# Patient Record
Sex: Female | Born: 1960 | Race: White | Hispanic: No | Smoking: Current every day smoker
Health system: Southern US, Community
[De-identification: ages and names within clinical notes are randomized; demographics above are authoritative.]

## PROBLEM LIST (undated history)

## (undated) DIAGNOSIS — C801 Malignant (primary) neoplasm, unspecified: Secondary | ICD-10-CM

## (undated) DIAGNOSIS — E119 Type 2 diabetes mellitus without complications: Secondary | ICD-10-CM

---

## 2004-03-18 ENCOUNTER — Emergency Department: Payer: Self-pay | Admitting: Emergency Medicine

## 2004-07-29 ENCOUNTER — Emergency Department: Payer: Self-pay | Admitting: Emergency Medicine

## 2005-05-13 ENCOUNTER — Emergency Department: Payer: Self-pay | Admitting: Unknown Physician Specialty

## 2005-10-19 ENCOUNTER — Other Ambulatory Visit: Payer: Self-pay

## 2005-10-19 ENCOUNTER — Emergency Department: Payer: Self-pay

## 2005-11-17 ENCOUNTER — Ambulatory Visit: Payer: Self-pay

## 2006-11-30 ENCOUNTER — Emergency Department: Payer: Self-pay | Admitting: Emergency Medicine

## 2006-11-30 ENCOUNTER — Other Ambulatory Visit: Payer: Self-pay

## 2007-11-23 ENCOUNTER — Other Ambulatory Visit: Payer: Self-pay

## 2007-11-23 ENCOUNTER — Emergency Department: Payer: Self-pay | Admitting: Emergency Medicine

## 2008-07-11 ENCOUNTER — Ambulatory Visit: Payer: Self-pay | Admitting: Ophthalmology

## 2008-07-24 ENCOUNTER — Ambulatory Visit: Payer: Self-pay | Admitting: Ophthalmology

## 2008-09-19 ENCOUNTER — Ambulatory Visit: Payer: Self-pay

## 2008-10-02 ENCOUNTER — Ambulatory Visit: Payer: Self-pay

## 2010-07-29 ENCOUNTER — Ambulatory Visit: Payer: Self-pay | Admitting: Family Medicine

## 2010-09-18 ENCOUNTER — Ambulatory Visit: Payer: Self-pay | Admitting: Family Medicine

## 2010-10-08 ENCOUNTER — Ambulatory Visit: Payer: Self-pay

## 2010-10-14 ENCOUNTER — Ambulatory Visit: Payer: Self-pay

## 2010-10-28 ENCOUNTER — Ambulatory Visit: Payer: Self-pay | Admitting: Unknown Physician Specialty

## 2010-10-30 ENCOUNTER — Ambulatory Visit: Payer: Self-pay | Admitting: Unknown Physician Specialty

## 2011-06-23 ENCOUNTER — Ambulatory Visit: Payer: Self-pay | Admitting: Internal Medicine

## 2011-07-01 ENCOUNTER — Ambulatory Visit: Payer: Self-pay | Admitting: Family Medicine

## 2011-07-09 ENCOUNTER — Ambulatory Visit: Payer: Self-pay | Admitting: Internal Medicine

## 2011-07-09 LAB — LACTATE DEHYDROGENASE: LDH: 117 U/L (ref 84–246)

## 2011-07-09 LAB — CBC CANCER CENTER
Basophil #: 0.2 x10 3/mm — ABNORMAL HIGH (ref 0.0–0.1)
Basophil %: 1 %
Eosinophil #: 0.1 x10 3/mm (ref 0.0–0.7)
Eosinophil %: 0.7 %
HCT: 44.1 % (ref 35.0–47.0)
HGB: 14.9 g/dL (ref 12.0–16.0)
Lymphocyte #: 2.8 x10 3/mm (ref 1.0–3.6)
Lymphocyte %: 17.4 %
MCH: 30 pg (ref 26.0–34.0)
MCHC: 33.8 g/dL (ref 32.0–36.0)
MCV: 89 fL (ref 80–100)
Monocyte #: 0.7 x10 3/mm (ref 0.2–0.9)
Monocyte %: 4.6 %
Neutrophil #: 12.1 x10 3/mm — ABNORMAL HIGH (ref 1.4–6.5)
Neutrophil %: 76.3 %
Platelet: 439 x10 3/mm (ref 150–440)
RBC: 4.97 10*6/uL (ref 3.80–5.20)
RDW: 14.1 % (ref 11.5–14.5)
WBC: 15.9 x10 3/mm — ABNORMAL HIGH (ref 3.6–11.0)

## 2011-07-09 LAB — SEDIMENTATION RATE: Erythrocyte Sed Rate: 17 mm/hr (ref 0–30)

## 2011-07-21 ENCOUNTER — Ambulatory Visit: Payer: Self-pay | Admitting: Internal Medicine

## 2011-07-23 LAB — CBC CANCER CENTER
Basophil #: 0.1 x10 3/mm (ref 0.0–0.1)
Eosinophil #: 0.1 x10 3/mm (ref 0.0–0.7)
HCT: 40.7 % (ref 35.0–47.0)
HGB: 13.4 g/dL (ref 12.0–16.0)
Lymphocyte #: 2.6 x10 3/mm (ref 1.0–3.6)
Lymphocyte %: 22 %
MCH: 29.2 pg (ref 26.0–34.0)
MCHC: 33 g/dL (ref 32.0–36.0)
MCV: 89 fL (ref 80–100)
Monocyte #: 0.7 x10 3/mm (ref 0.2–0.9)
Monocyte %: 6 %
Neutrophil #: 8.3 x10 3/mm — ABNORMAL HIGH (ref 1.4–6.5)
Neutrophil %: 70.2 %
Platelet: 458 x10 3/mm — ABNORMAL HIGH (ref 150–440)
RBC: 4.59 10*6/uL (ref 3.80–5.20)
RDW: 14.5 % (ref 11.5–14.5)

## 2011-08-01 ENCOUNTER — Ambulatory Visit: Payer: Self-pay | Admitting: Internal Medicine

## 2011-09-23 ENCOUNTER — Ambulatory Visit: Payer: Self-pay | Admitting: Obstetrics & Gynecology

## 2011-09-23 LAB — BASIC METABOLIC PANEL
Anion Gap: 8 (ref 7–16)
Calcium, Total: 8.6 mg/dL (ref 8.5–10.1)
Chloride: 100 mmol/L (ref 98–107)
Co2: 30 mmol/L (ref 21–32)
Creatinine: 0.68 mg/dL (ref 0.60–1.30)
EGFR (African American): >60
EGFR (Non-African Amer.): >60
Glucose: 139 mg/dL — ABNORMAL HIGH (ref 65–99)
Osmolality: 276 (ref 275–301)
Sodium: 138 mmol/L (ref 136–145)

## 2011-09-23 LAB — CBC
HCT: 44.5 % (ref 35.0–47.0)
HGB: 14.9 g/dL (ref 12.0–16.0)
MCH: 29.7 pg (ref 26.0–34.0)
MCHC: 33.5 g/dL (ref 32.0–36.0)
MCV: 89 fL (ref 80–100)
RBC: 5.01 10*6/uL (ref 3.80–5.20)
WBC: 12.6 10*3/uL — ABNORMAL HIGH (ref 3.6–11.0)

## 2011-09-25 ENCOUNTER — Ambulatory Visit: Payer: Self-pay | Admitting: Obstetrics & Gynecology

## 2011-09-25 LAB — POTASSIUM: Potassium: 3 mmol/L — ABNORMAL LOW (ref 3.5–5.1)

## 2011-09-30 LAB — PATHOLOGY REPORT

## 2011-10-06 ENCOUNTER — Ambulatory Visit: Payer: Self-pay | Admitting: Family Medicine

## 2011-10-20 ENCOUNTER — Ambulatory Visit: Payer: Self-pay | Admitting: Internal Medicine

## 2011-10-20 ENCOUNTER — Ambulatory Visit: Payer: Self-pay | Admitting: Obstetrics & Gynecology

## 2011-10-20 LAB — CBC
HCT: 44.8 % (ref 35.0–47.0)
MCH: 29.5 pg (ref 26.0–34.0)
MCHC: 33.5 g/dL (ref 32.0–36.0)
MCV: 88 fL (ref 80–100)
Platelet: 567 10*3/uL — ABNORMAL HIGH (ref 150–440)
RBC: 5.07 10*6/uL (ref 3.80–5.20)
RDW: 13.7 % (ref 11.5–14.5)

## 2011-10-20 LAB — BASIC METABOLIC PANEL
Anion Gap: 11 (ref 7–16)
Chloride: 101 mmol/L (ref 98–107)
Co2: 27 mmol/L (ref 21–32)
EGFR (African American): >60
EGFR (Non-African Amer.): >60
Glucose: 127 mg/dL — ABNORMAL HIGH (ref 65–99)
Potassium: 2.9 mmol/L — ABNORMAL LOW (ref 3.5–5.1)

## 2011-10-20 LAB — PROTIME-INR
INR: 1
Prothrombin Time: 14 secs (ref 11.5–14.7)

## 2011-10-27 ENCOUNTER — Inpatient Hospital Stay: Payer: Self-pay | Admitting: Obstetrics & Gynecology

## 2011-10-27 LAB — POTASSIUM: Potassium: 3.5 mmol/L (ref 3.5–5.1)

## 2011-10-27 LAB — PREGNANCY, URINE: Pregnancy Test, Urine: NEGATIVE m[IU]/mL

## 2011-10-28 LAB — CREATININE, SERUM
Creatinine: 0.62 mg/dL (ref 0.60–1.30)
EGFR (African American): >60
EGFR (Non-African Amer.): >60

## 2011-11-01 ENCOUNTER — Ambulatory Visit: Payer: Self-pay | Admitting: Internal Medicine

## 2011-11-03 ENCOUNTER — Ambulatory Visit: Payer: Self-pay | Admitting: Internal Medicine

## 2011-12-01 ENCOUNTER — Ambulatory Visit: Payer: Self-pay | Admitting: Internal Medicine

## 2011-12-08 LAB — COMPREHENSIVE METABOLIC PANEL
Alkaline Phosphatase: 111 U/L (ref 50–136)
BUN: 15 mg/dL (ref 7–18)
Chloride: 102 mmol/L (ref 98–107)
Creatinine: 0.68 mg/dL (ref 0.60–1.30)
EGFR (African American): >60
EGFR (Non-African Amer.): >60
SGOT(AST): 15 U/L (ref 15–37)
SGPT (ALT): 20 U/L (ref 12–78)
Total Protein: 7.2 g/dL (ref 6.4–8.2)

## 2011-12-08 LAB — URINALYSIS, COMPLETE
Bacteria: NONE SEEN
Glucose,UR: NEGATIVE mg/dL (ref 0–75)
Nitrite: NEGATIVE
Protein: 30
RBC,UR: <1 /HPF (ref 0–5)
Squamous Epithelial: <1
WBC UR: 2 /HPF (ref 0–5)

## 2011-12-08 LAB — CBC CANCER CENTER
Eosinophil #: 0.1 x10 3/mm (ref 0.0–0.7)
HCT: 40.2 % (ref 35.0–47.0)
HGB: 13.3 g/dL (ref 12.0–16.0)
Lymphocyte %: 18.5 %
MCH: 29 pg (ref 26.0–34.0)
MCHC: 33.2 g/dL (ref 32.0–36.0)
MCV: 87 fL (ref 80–100)
Monocyte #: 0.8 x10 3/mm (ref 0.2–0.9)
Monocyte %: 6.3 %
Neutrophil #: 9.5 x10 3/mm — ABNORMAL HIGH (ref 1.4–6.5)
Neutrophil %: 73.3 %
Platelet: 465 x10 3/mm — ABNORMAL HIGH (ref 150–440)
RBC: 4.6 10*6/uL (ref 3.80–5.20)

## 2011-12-15 ENCOUNTER — Ambulatory Visit: Payer: Self-pay | Admitting: Oncology

## 2011-12-17 ENCOUNTER — Ambulatory Visit: Payer: Self-pay | Admitting: Vascular Surgery

## 2011-12-29 LAB — CBC CANCER CENTER
Basophil #: 0.2 x10 3/mm — ABNORMAL HIGH (ref 0.0–0.1)
Basophil %: 0.9 %
Eosinophil %: 0.5 %
HCT: 46 % (ref 35.0–47.0)
HGB: 14.6 g/dL (ref 12.0–16.0)
Lymphocyte #: 1.6 x10 3/mm (ref 1.0–3.6)
MCH: 28.2 pg (ref 26.0–34.0)
MCHC: 31.8 g/dL — ABNORMAL LOW (ref 32.0–36.0)
Neutrophil #: 18.1 x10 3/mm — ABNORMAL HIGH (ref 1.4–6.5)
Neutrophil %: 87.8 %
RDW: 14.7 % — ABNORMAL HIGH (ref 11.5–14.5)
WBC: 20.6 x10 3/mm — ABNORMAL HIGH (ref 3.6–11.0)

## 2011-12-29 LAB — COMPREHENSIVE METABOLIC PANEL
Alkaline Phosphatase: 208 U/L — ABNORMAL HIGH (ref 50–136)
Anion Gap: 19 — ABNORMAL HIGH (ref 7–16)
BUN: 16 mg/dL (ref 7–18)
Bilirubin,Total: 0.4 mg/dL (ref 0.2–1.0)
Calcium, Total: 9.2 mg/dL (ref 8.5–10.1)
Co2: 22 mmol/L (ref 21–32)
EGFR (Non-African Amer.): 38 — ABNORMAL LOW
Osmolality: 283 (ref 275–301)
Potassium: 2.9 mmol/L — ABNORMAL LOW (ref 3.5–5.1)
SGOT(AST): 16 U/L (ref 15–37)
Sodium: 133 mmol/L — ABNORMAL LOW (ref 136–145)

## 2011-12-31 LAB — BASIC METABOLIC PANEL
BUN: 14 mg/dL (ref 7–18)
Calcium, Total: 8.8 mg/dL (ref 8.5–10.1)
Chloride: 95 mmol/L — ABNORMAL LOW (ref 98–107)
Co2: 27 mmol/L (ref 21–32)
Creatinine: 0.91 mg/dL (ref 0.60–1.30)
EGFR (African American): >60
EGFR (Non-African Amer.): >60
Potassium: 3.5 mmol/L (ref 3.5–5.1)
Sodium: 135 mmol/L — ABNORMAL LOW (ref 136–145)

## 2011-12-31 LAB — CBC CANCER CENTER
Eosinophil %: 0.7 %
HCT: 46.2 % (ref 35.0–47.0)
HGB: 14.7 g/dL (ref 12.0–16.0)
Monocyte %: 9 %
WBC: 17.5 x10 3/mm — ABNORMAL HIGH (ref 3.6–11.0)

## 2012-01-01 ENCOUNTER — Ambulatory Visit: Payer: Self-pay | Admitting: Internal Medicine

## 2012-01-07 LAB — CBC CANCER CENTER
Basophil #: 0 x10 3/mm (ref 0.0–0.1)
Basophil %: 0.2 %
Eosinophil #: 0.2 x10 3/mm (ref 0.0–0.7)
HCT: 40.4 % (ref 35.0–47.0)
HGB: 13 g/dL (ref 12.0–16.0)
MCH: 28.1 pg (ref 26.0–34.0)
Monocyte #: 0.9 x10 3/mm (ref 0.2–0.9)
Neutrophil #: 13 x10 3/mm — ABNORMAL HIGH (ref 1.4–6.5)
Neutrophil %: 75.6 %
Platelet: 256 x10 3/mm (ref 150–440)
RBC: 4.62 10*6/uL (ref 3.80–5.20)
RDW: 14.4 % (ref 11.5–14.5)
WBC: 17.2 x10 3/mm — ABNORMAL HIGH (ref 3.6–11.0)

## 2012-01-14 LAB — CBC CANCER CENTER
Basophil #: 0.1 x10 3/mm (ref 0.0–0.1)
Eosinophil %: 0.2 %
HGB: 13.4 g/dL (ref 12.0–16.0)
Lymphocyte #: 1.8 x10 3/mm (ref 1.0–3.6)
MCH: 28.9 pg (ref 26.0–34.0)
MCV: 88 fL (ref 80–100)
Monocyte #: 0.6 x10 3/mm (ref 0.2–0.9)
Platelet: 298 x10 3/mm (ref 150–440)
RDW: 15.2 % — ABNORMAL HIGH (ref 11.5–14.5)

## 2012-01-14 LAB — COMPREHENSIVE METABOLIC PANEL
Albumin: 3.1 g/dL — ABNORMAL LOW (ref 3.4–5.0)
Alkaline Phosphatase: 140 U/L — ABNORMAL HIGH (ref 50–136)
BUN: 11 mg/dL (ref 7–18)
Calcium, Total: 9 mg/dL (ref 8.5–10.1)
Creatinine: 0.91 mg/dL (ref 0.60–1.30)
EGFR (Non-African Amer.): >60
Glucose: 268 mg/dL — ABNORMAL HIGH (ref 65–99)
Osmolality: 286 (ref 275–301)
Potassium: 3.1 mmol/L — ABNORMAL LOW (ref 3.5–5.1)
SGOT(AST): 17 U/L (ref 15–37)
SGPT (ALT): 24 U/L (ref 12–78)
Sodium: 139 mmol/L (ref 136–145)
Total Protein: 7.3 g/dL (ref 6.4–8.2)

## 2012-01-21 LAB — CBC CANCER CENTER
Basophil %: 0.9 %
Eosinophil %: 0.2 %
HCT: 42.9 % (ref 35.0–47.0)
HGB: 13.7 g/dL (ref 12.0–16.0)
Lymphocyte %: 16.3 %
MCH: 28.5 pg (ref 26.0–34.0)
Monocyte %: 7.4 %
Neutrophil %: 75.2 %
Platelet: 207 x10 3/mm (ref 150–440)
RBC: 4.82 10*6/uL (ref 3.80–5.20)
WBC: 17.2 x10 3/mm — ABNORMAL HIGH (ref 3.6–11.0)

## 2012-01-27 LAB — CBC CANCER CENTER
Eosinophil %: 0.2 %
HGB: 13.1 g/dL (ref 12.0–16.0)
Lymphocyte #: 1.7 x10 3/mm (ref 1.0–3.6)
Lymphocyte %: 13.3 %
MCH: 28.8 pg (ref 26.0–34.0)
Monocyte %: 3.8 %
Neutrophil #: 10.6 x10 3/mm — ABNORMAL HIGH (ref 1.4–6.5)
Platelet: 259 x10 3/mm (ref 150–440)
RDW: 16.6 % — ABNORMAL HIGH (ref 11.5–14.5)
WBC: 13 x10 3/mm — ABNORMAL HIGH (ref 3.6–11.0)

## 2012-01-27 LAB — BASIC METABOLIC PANEL
Anion Gap: 14 (ref 7–16)
Calcium, Total: 9.2 mg/dL (ref 8.5–10.1)
Co2: 24 mmol/L (ref 21–32)
EGFR (Non-African Amer.): >60
Glucose: 221 mg/dL — ABNORMAL HIGH (ref 65–99)
Osmolality: 285 (ref 275–301)
Potassium: 3.7 mmol/L (ref 3.5–5.1)

## 2012-01-27 LAB — HEPATIC FUNCTION PANEL A (ARMC)
Alkaline Phosphatase: 182 U/L — ABNORMAL HIGH (ref 50–136)
Bilirubin, Direct: 0.1 mg/dL (ref 0.00–0.20)
Bilirubin,Total: 0.3 mg/dL (ref 0.2–1.0)
SGPT (ALT): 29 U/L (ref 12–78)
Total Protein: 7 g/dL (ref 6.4–8.2)

## 2012-01-31 ENCOUNTER — Ambulatory Visit: Payer: Self-pay | Admitting: Internal Medicine

## 2012-02-04 LAB — COMPREHENSIVE METABOLIC PANEL
Albumin: 2.9 g/dL — ABNORMAL LOW (ref 3.4–5.0)
Alkaline Phosphatase: 116 U/L (ref 50–136)
BUN: 17 mg/dL (ref 7–18)
Calcium, Total: 9 mg/dL (ref 8.5–10.1)
Co2: 27 mmol/L (ref 21–32)
EGFR (Non-African Amer.): >60
Glucose: 225 mg/dL — ABNORMAL HIGH (ref 65–99)
Osmolality: 294 (ref 275–301)
Potassium: 3.4 mmol/L — ABNORMAL LOW (ref 3.5–5.1)
SGOT(AST): 14 U/L — ABNORMAL LOW (ref 15–37)
SGPT (ALT): 22 U/L (ref 12–78)
Sodium: 143 mmol/L (ref 136–145)

## 2012-02-04 LAB — CBC CANCER CENTER
Basophil #: 0.1 x10 3/mm (ref 0.0–0.1)
HCT: 34.7 % — ABNORMAL LOW (ref 35.0–47.0)
Lymphocyte #: 2.2 x10 3/mm (ref 1.0–3.6)
Lymphocyte %: 17.8 %
MCH: 30.3 pg (ref 26.0–34.0)
MCHC: 34.4 g/dL (ref 32.0–36.0)
MCV: 88 fL (ref 80–100)
Monocyte %: 5.6 %
Neutrophil #: 9.2 x10 3/mm — ABNORMAL HIGH (ref 1.4–6.5)
RDW: 17.4 % — ABNORMAL HIGH (ref 11.5–14.5)
WBC: 12.2 x10 3/mm — ABNORMAL HIGH (ref 3.6–11.0)

## 2012-02-11 LAB — CBC CANCER CENTER
Basophil #: 0.1 x10 3/mm (ref 0.0–0.1)
Basophil %: 1.3 %
Eosinophil #: 0.1 x10 3/mm (ref 0.0–0.7)
Eosinophil %: 1.3 %
HCT: 36.3 % (ref 35.0–47.0)
HGB: 12.5 g/dL (ref 12.0–16.0)
Lymphocyte #: 2.7 x10 3/mm (ref 1.0–3.6)
Lymphocyte %: 26.1 %
MCV: 88 fL (ref 80–100)
Monocyte #: 1.2 x10 3/mm — ABNORMAL HIGH (ref 0.2–0.9)
Neutrophil #: 6.1 x10 3/mm (ref 1.4–6.5)
Neutrophil %: 59.5 %
Platelet: 275 x10 3/mm (ref 150–440)
RBC: 4.13 10*6/uL (ref 3.80–5.20)

## 2012-02-18 LAB — CBC CANCER CENTER
Basophil %: 0.8 %
Eosinophil %: 0.1 %
MCH: 29.6 pg (ref 26.0–34.0)
MCV: 88 fL (ref 80–100)
Monocyte #: 1.1 x10 3/mm — ABNORMAL HIGH (ref 0.2–0.9)
Monocyte %: 7.7 %
Neutrophil #: 9.8 x10 3/mm — ABNORMAL HIGH (ref 1.4–6.5)
Neutrophil %: 70.2 %
Platelet: 320 x10 3/mm (ref 150–440)
RDW: 19.6 % — ABNORMAL HIGH (ref 11.5–14.5)
WBC: 14 x10 3/mm — ABNORMAL HIGH (ref 3.6–11.0)

## 2012-02-25 LAB — CBC CANCER CENTER
Basophil #: 0.2 x10 3/mm — ABNORMAL HIGH (ref 0.0–0.1)
Basophil %: 1.4 %
Eosinophil #: 0 x10 3/mm (ref 0.0–0.7)
Eosinophil %: 0.3 %
HCT: 38.4 % (ref 35.0–47.0)
HGB: 13 g/dL (ref 12.0–16.0)
Lymphocyte #: 2.7 x10 3/mm (ref 1.0–3.6)
Lymphocyte %: 22.5 %
MCHC: 33.8 g/dL (ref 32.0–36.0)
MCV: 90 fL (ref 80–100)
Monocyte #: 0.9 x10 3/mm (ref 0.2–0.9)
Monocyte %: 7.9 %
Neutrophil #: 8.1 x10 3/mm — ABNORMAL HIGH (ref 1.4–6.5)
WBC: 11.9 x10 3/mm — ABNORMAL HIGH (ref 3.6–11.0)

## 2012-03-02 ENCOUNTER — Ambulatory Visit: Payer: Self-pay | Admitting: Internal Medicine

## 2012-03-03 LAB — CBC CANCER CENTER
Basophil #: 0.1 x10 3/mm (ref 0.0–0.1)
Basophil %: 0.6 %
Eosinophil #: 0.1 x10 3/mm (ref 0.0–0.7)
Eosinophil %: 0.5 %
HCT: 32.9 % — ABNORMAL LOW (ref 35.0–47.0)
Lymphocyte %: 16.9 %
MCHC: 33.3 g/dL (ref 32.0–36.0)
Monocyte %: 5.8 %
Neutrophil #: 8.9 x10 3/mm — ABNORMAL HIGH (ref 1.4–6.5)
Platelet: 340 x10 3/mm (ref 150–440)
RBC: 3.56 10*6/uL — ABNORMAL LOW (ref 3.80–5.20)
RDW: 19.6 % — ABNORMAL HIGH (ref 11.5–14.5)
WBC: 11.7 x10 3/mm — ABNORMAL HIGH (ref 3.6–11.0)

## 2012-03-03 LAB — COMPREHENSIVE METABOLIC PANEL
Alkaline Phosphatase: 143 U/L — ABNORMAL HIGH (ref 50–136)
Anion Gap: 11 (ref 7–16)
BUN: 18 mg/dL (ref 7–18)
Bilirubin,Total: 0.2 mg/dL (ref 0.2–1.0)
Calcium, Total: 8.4 mg/dL — ABNORMAL LOW (ref 8.5–10.1)
Chloride: 99 mmol/L (ref 98–107)
Creatinine: 0.9 mg/dL (ref 0.60–1.30)
EGFR (African American): >60
EGFR (Non-African Amer.): >60
Glucose: 380 mg/dL — ABNORMAL HIGH (ref 65–99)
Osmolality: 293 (ref 275–301)
SGOT(AST): 10 U/L — ABNORMAL LOW (ref 15–37)
SGPT (ALT): 14 U/L (ref 12–78)
Sodium: 138 mmol/L (ref 136–145)

## 2012-03-31 LAB — CBC CANCER CENTER
Basophil #: 0.2 x10 3/mm — ABNORMAL HIGH (ref 0.0–0.1)
Eosinophil #: 0 x10 3/mm (ref 0.0–0.7)
Eosinophil %: 0.3 %
HCT: 39.1 % (ref 35.0–47.0)
Lymphocyte #: 3.3 x10 3/mm (ref 1.0–3.6)
Lymphocyte %: 29.5 %
MCHC: 34.4 g/dL (ref 32.0–36.0)
Monocyte %: 9 %
RBC: 4.1 10*6/uL (ref 3.80–5.20)
RDW: 16.2 % — ABNORMAL HIGH (ref 11.5–14.5)
WBC: 11 x10 3/mm (ref 3.6–11.0)

## 2012-04-02 ENCOUNTER — Ambulatory Visit: Payer: Self-pay | Admitting: Internal Medicine

## 2012-04-07 LAB — CBC CANCER CENTER
Basophil #: 0 x10 3/mm (ref 0.0–0.1)
Eosinophil #: 0.1 x10 3/mm (ref 0.0–0.7)
HGB: 11.6 g/dL — ABNORMAL LOW (ref 12.0–16.0)
Lymphocyte #: 1.3 x10 3/mm (ref 1.0–3.6)
Lymphocyte %: 24.9 %
MCV: 97 fL (ref 80–100)
Monocyte #: 0.4 x10 3/mm (ref 0.2–0.9)
Neutrophil %: 66 %
RBC: 3.38 10*6/uL — ABNORMAL LOW (ref 3.80–5.20)
RDW: 15.5 % — ABNORMAL HIGH (ref 11.5–14.5)
WBC: 5.1 x10 3/mm (ref 3.6–11.0)

## 2012-04-21 LAB — CBC CANCER CENTER
Basophil #: 0 x10 3/mm (ref 0.0–0.1)
Eosinophil %: 3.1 %
HCT: 36.4 % (ref 35.0–47.0)
Lymphocyte #: 0.8 x10 3/mm — ABNORMAL LOW (ref 1.0–3.6)
Lymphocyte %: 16.5 %
MCH: 33.8 pg (ref 26.0–34.0)
MCHC: 34 g/dL (ref 32.0–36.0)
Monocyte #: 0.4 x10 3/mm (ref 0.2–0.9)
Monocyte %: 7.8 %
Neutrophil #: 3.6 x10 3/mm (ref 1.4–6.5)
Neutrophil %: 71.8 %
Platelet: 215 x10 3/mm (ref 150–440)
RBC: 3.66 10*6/uL — ABNORMAL LOW (ref 3.80–5.20)
RDW: 13.9 % (ref 11.5–14.5)
WBC: 5 x10 3/mm (ref 3.6–11.0)

## 2012-04-28 LAB — CBC CANCER CENTER
Eosinophil %: 6 %
HGB: 13.1 g/dL (ref 12.0–16.0)
Lymphocyte #: 0.8 x10 3/mm — ABNORMAL LOW (ref 1.0–3.6)
MCH: 34.3 pg — ABNORMAL HIGH (ref 26.0–34.0)
MCV: 99 fL (ref 80–100)
Monocyte #: 0.5 x10 3/mm (ref 0.2–0.9)
Monocyte %: 9 %
Neutrophil #: 4.1 x10 3/mm (ref 1.4–6.5)
Platelet: 259 x10 3/mm (ref 150–440)
RBC: 3.82 10*6/uL (ref 3.80–5.20)
WBC: 5.7 x10 3/mm (ref 3.6–11.0)

## 2012-04-30 ENCOUNTER — Ambulatory Visit: Payer: Self-pay | Admitting: Internal Medicine

## 2012-05-07 ENCOUNTER — Emergency Department: Payer: Self-pay | Admitting: Emergency Medicine

## 2012-05-07 LAB — COMPREHENSIVE METABOLIC PANEL
Albumin: 3.2 g/dL — ABNORMAL LOW (ref 3.4–5.0)
Alkaline Phosphatase: 106 U/L (ref 50–136)
Anion Gap: 13 (ref 7–16)
BUN: 21 mg/dL — ABNORMAL HIGH (ref 7–18)
Bilirubin,Total: 0.3 mg/dL (ref 0.2–1.0)
Calcium, Total: 8.8 mg/dL (ref 8.5–10.1)
Chloride: 102 mmol/L (ref 98–107)
Co2: 24 mmol/L (ref 21–32)
Creatinine: 1.33 mg/dL — ABNORMAL HIGH (ref 0.60–1.30)
EGFR (African American): 53 — ABNORMAL LOW
EGFR (Non-African Amer.): 46 — ABNORMAL LOW
Glucose: 166 mg/dL — ABNORMAL HIGH (ref 65–99)
Osmolality: 284 (ref 275–301)
Potassium: 2.9 mmol/L — ABNORMAL LOW (ref 3.5–5.1)
SGOT(AST): 24 U/L (ref 15–37)
SGPT (ALT): 35 U/L (ref 12–78)
Sodium: 139 mmol/L (ref 136–145)
Total Protein: 7.4 g/dL (ref 6.4–8.2)

## 2012-05-07 LAB — CBC WITH DIFFERENTIAL/PLATELET
Basophil %: 0.8 %
Eosinophil %: 0.5 %
HGB: 12.9 g/dL (ref 12.0–16.0)
MCH: 34.1 pg — ABNORMAL HIGH (ref 26.0–34.0)
MCV: 99 fL (ref 80–100)
Monocyte #: 0.8 x10 3/mm (ref 0.2–0.9)
Monocyte %: 10.1 %
Neutrophil #: 6.1 10*3/uL (ref 1.4–6.5)
Platelet: 304 10*3/uL (ref 150–440)
RBC: 3.77 10*6/uL — ABNORMAL LOW (ref 3.80–5.20)
RDW: 13.8 % (ref 11.5–14.5)

## 2012-05-07 LAB — TROPONIN I
Troponin-I: <0.02 ng/mL
Troponin-I: <0.02 ng/mL

## 2012-05-31 ENCOUNTER — Ambulatory Visit: Payer: Self-pay | Admitting: Internal Medicine

## 2012-06-30 ENCOUNTER — Ambulatory Visit: Payer: Self-pay | Admitting: Internal Medicine

## 2012-07-19 LAB — COMPREHENSIVE METABOLIC PANEL
Albumin: 2.8 g/dL — ABNORMAL LOW (ref 3.4–5.0)
Anion Gap: 4 — ABNORMAL LOW (ref 7–16)
BUN: 11 mg/dL (ref 7–18)
Calcium, Total: 9.3 mg/dL (ref 8.5–10.1)
Glucose: 146 mg/dL — ABNORMAL HIGH (ref 65–99)
Osmolality: 280 (ref 275–301)
Potassium: 3 mmol/L — ABNORMAL LOW (ref 3.5–5.1)
SGOT(AST): 13 U/L — ABNORMAL LOW (ref 15–37)
Sodium: 139 mmol/L (ref 136–145)
Total Protein: 7 g/dL (ref 6.4–8.2)

## 2012-07-19 LAB — CBC CANCER CENTER
Basophil #: 0.1 x10 3/mm (ref 0.0–0.1)
Basophil %: 1.2 %
Eosinophil #: 0 x10 3/mm (ref 0.0–0.7)
HCT: 38.3 % (ref 35.0–47.0)
Lymphocyte #: 0.8 x10 3/mm — ABNORMAL LOW (ref 1.0–3.6)
Lymphocyte %: 13.1 %
MCHC: 34.6 g/dL (ref 32.0–36.0)
Neutrophil #: 5 x10 3/mm (ref 1.4–6.5)
Platelet: 332 x10 3/mm (ref 150–440)
RBC: 4.07 10*6/uL (ref 3.80–5.20)
RDW: 12.9 % (ref 11.5–14.5)
WBC: 6.4 x10 3/mm (ref 3.6–11.0)

## 2012-07-20 LAB — CA 125: CA 125: 6.3 U/mL (ref 0.0–34.0)

## 2012-07-31 ENCOUNTER — Ambulatory Visit: Payer: Self-pay | Admitting: Internal Medicine

## 2012-08-24 ENCOUNTER — Ambulatory Visit: Payer: Self-pay | Admitting: Vascular Surgery

## 2012-08-30 ENCOUNTER — Ambulatory Visit: Payer: Self-pay | Admitting: Internal Medicine

## 2012-10-04 ENCOUNTER — Ambulatory Visit: Payer: Self-pay | Admitting: Oncology

## 2012-10-04 LAB — CREATININE, SERUM
Creatinine: 0.78 mg/dL (ref 0.60–1.30)
EGFR (African American): >60

## 2012-10-05 ENCOUNTER — Ambulatory Visit: Payer: Self-pay | Admitting: Oncology

## 2012-10-10 ENCOUNTER — Ambulatory Visit: Payer: Self-pay | Admitting: Oncology

## 2012-10-31 ENCOUNTER — Ambulatory Visit: Payer: Self-pay | Admitting: Oncology

## 2012-11-22 ENCOUNTER — Ambulatory Visit: Payer: Self-pay | Admitting: Family Medicine

## 2012-12-20 ENCOUNTER — Ambulatory Visit: Payer: Self-pay | Admitting: Oncology

## 2012-12-31 ENCOUNTER — Ambulatory Visit: Payer: Self-pay | Admitting: Oncology

## 2013-03-08 ENCOUNTER — Ambulatory Visit: Payer: Self-pay | Admitting: Oncology

## 2013-04-15 ENCOUNTER — Emergency Department: Payer: Self-pay

## 2013-04-15 LAB — URINALYSIS, COMPLETE
Bacteria: NONE SEEN
Bilirubin,UR: NEGATIVE
Glucose,UR: >=500 mg/dL (ref 0–75)
Hyaline Cast: 5
LEUKOCYTE ESTERASE: NEGATIVE
Nitrite: NEGATIVE
PH: 6 (ref 4.5–8.0)
Protein: 100
RBC,UR: 3 /HPF (ref 0–5)
SPECIFIC GRAVITY: 1.014 (ref 1.003–1.030)

## 2013-04-15 LAB — CBC
HCT: 45.3 % (ref 35.0–47.0)
HGB: 15 g/dL (ref 12.0–16.0)
MCH: 29.6 pg (ref 26.0–34.0)
MCHC: 33.1 g/dL (ref 32.0–36.0)
MCV: 90 fL (ref 80–100)
Platelet: 317 10*3/uL (ref 150–440)
RBC: 5.06 10*6/uL (ref 3.80–5.20)
RDW: 14.8 % — AB (ref 11.5–14.5)
WBC: 10.9 10*3/uL (ref 3.6–11.0)

## 2013-04-15 LAB — DRUG SCREEN, URINE
Amphetamines, Ur Screen: NEGATIVE (ref ?–1000)
BENZODIAZEPINE, UR SCRN: NEGATIVE (ref ?–200)
Barbiturates, Ur Screen: NEGATIVE (ref ?–200)
CANNABINOID 50 NG, UR ~~LOC~~: POSITIVE (ref ?–50)
Cocaine Metabolite,Ur ~~LOC~~: NEGATIVE (ref ?–300)
MDMA (ECSTASY) UR SCREEN: NEGATIVE (ref ?–500)
Methadone, Ur Screen: NEGATIVE (ref ?–300)
OPIATE, UR SCREEN: NEGATIVE (ref ?–300)
Phencyclidine (PCP) Ur S: NEGATIVE (ref ?–25)
Tricyclic, Ur Screen: NEGATIVE (ref ?–1000)

## 2013-04-15 LAB — TSH: Thyroid Stimulating Horm: 0.5 u[IU]/mL

## 2013-04-15 LAB — ETHANOL: Ethanol %: <0.003 % (ref 0.000–0.080)

## 2013-04-15 LAB — COMPREHENSIVE METABOLIC PANEL
ALBUMIN: 2.9 g/dL — AB (ref 3.4–5.0)
ANION GAP: 8 (ref 7–16)
Alkaline Phosphatase: 122 U/L — ABNORMAL HIGH
BUN: 6 mg/dL — ABNORMAL LOW (ref 7–18)
Bilirubin,Total: 0.4 mg/dL (ref 0.2–1.0)
CHLORIDE: 99 mmol/L (ref 98–107)
CREATININE: 0.67 mg/dL (ref 0.60–1.30)
Calcium, Total: 8.8 mg/dL (ref 8.5–10.1)
Co2: 29 mmol/L (ref 21–32)
EGFR (African American): >60
EGFR (Non-African Amer.): >60
Glucose: 250 mg/dL — ABNORMAL HIGH (ref 65–99)
Osmolality: 278 (ref 275–301)
POTASSIUM: 2.7 mmol/L — AB (ref 3.5–5.1)
SGOT(AST): 12 U/L — ABNORMAL LOW (ref 15–37)
SGPT (ALT): 16 U/L (ref 12–78)
Sodium: 136 mmol/L (ref 136–145)
Total Protein: 7.4 g/dL (ref 6.4–8.2)

## 2013-05-01 ENCOUNTER — Ambulatory Visit: Payer: Self-pay | Admitting: Oncology

## 2014-03-09 DIAGNOSIS — J449 Chronic obstructive pulmonary disease, unspecified: Secondary | ICD-10-CM | POA: Insufficient documentation

## 2014-03-09 DIAGNOSIS — E119 Type 2 diabetes mellitus without complications: Secondary | ICD-10-CM | POA: Insufficient documentation

## 2014-03-09 DIAGNOSIS — C55 Malignant neoplasm of uterus, part unspecified: Secondary | ICD-10-CM | POA: Insufficient documentation

## 2014-03-09 DIAGNOSIS — I10 Essential (primary) hypertension: Secondary | ICD-10-CM | POA: Insufficient documentation

## 2014-03-14 ENCOUNTER — Ambulatory Visit: Payer: Self-pay | Admitting: Oncology

## 2014-03-14 LAB — CREATININE, SERUM
Creatinine: 0.67 mg/dL (ref 0.60–1.30)
EGFR (African American): >60

## 2014-03-15 LAB — CA 125: CA 125: 89.7 U/mL — ABNORMAL HIGH (ref 0.0–34.0)

## 2014-04-02 ENCOUNTER — Ambulatory Visit: Payer: Self-pay | Admitting: Oncology

## 2014-04-03 ENCOUNTER — Ambulatory Visit: Payer: Self-pay | Admitting: Oncology

## 2014-04-12 ENCOUNTER — Ambulatory Visit: Payer: Self-pay | Admitting: Vascular Surgery

## 2014-05-01 ENCOUNTER — Ambulatory Visit
Admit: 2014-05-01 | Disposition: A | Discharge status: Home / Self Care | Payer: Self-pay | Attending: Oncology | Admitting: Oncology

## 2014-05-29 LAB — CBC CANCER CENTER
BASOS PCT: 0.5 %
Basophil #: 0 x10 3/mm (ref 0.0–0.1)
EOS PCT: 0.5 %
Eosinophil #: 0 x10 3/mm (ref 0.0–0.7)
HCT: 34 % — AB (ref 35.0–47.0)
HGB: 11.6 g/dL — AB (ref 12.0–16.0)
Lymphocyte #: 1.2 x10 3/mm (ref 1.0–3.6)
Lymphocyte %: 23.7 %
MCH: 29.7 pg (ref 26.0–34.0)
MCHC: 34.2 g/dL (ref 32.0–36.0)
MCV: 87 fL (ref 80–100)
MONO ABS: 0.4 x10 3/mm (ref 0.2–0.9)
Monocyte %: 8.4 %
NEUTROS ABS: 3.4 x10 3/mm (ref 1.4–6.5)
NEUTROS PCT: 66.9 %
Platelet: 138 x10 3/mm — ABNORMAL LOW (ref 150–440)
RBC: 3.92 10*6/uL (ref 3.80–5.20)
RDW: 16.6 % — ABNORMAL HIGH (ref 11.5–14.5)
WBC: 5.1 x10 3/mm (ref 3.6–11.0)

## 2014-05-29 LAB — COMPREHENSIVE METABOLIC PANEL
ALBUMIN: 3.2 g/dL — AB
ALK PHOS: 93 U/L
ALT: 13 U/L — AB
ANION GAP: 3 — AB (ref 7–16)
AST: 18 U/L
BUN: 15 mg/dL
Bilirubin,Total: 0.4 mg/dL
CHLORIDE: 104 mmol/L
CO2: 28 mmol/L
CREATININE: 0.55 mg/dL
Calcium, Total: 8.8 mg/dL — ABNORMAL LOW
Glucose: 221 mg/dL — ABNORMAL HIGH
POTASSIUM: 3.3 mmol/L — AB
Sodium: 135 mmol/L
Total Protein: 6.8 g/dL

## 2014-05-29 LAB — MAGNESIUM: Magnesium: 1.2 mg/dL — ABNORMAL LOW

## 2014-06-01 ENCOUNTER — Ambulatory Visit
Admit: 2014-06-01 | Disposition: A | Discharge status: Home / Self Care | Payer: Self-pay | Attending: Oncology | Admitting: Oncology

## 2014-06-07 DIAGNOSIS — Z66 Do not resuscitate: Secondary | ICD-10-CM | POA: Insufficient documentation

## 2014-06-19 LAB — COMPREHENSIVE METABOLIC PANEL
ALBUMIN: 3.4 g/dL — AB
ALK PHOS: 91 U/L
ANION GAP: 9 (ref 7–16)
BUN: 20 mg/dL
Bilirubin,Total: 0.3 mg/dL
CALCIUM: 9 mg/dL
CREATININE: 0.6 mg/dL
Chloride: 101 mmol/L
Co2: 28 mmol/L
EGFR (African American): >60
EGFR (Non-African Amer.): >60
GLUCOSE: 140 mg/dL — AB
Potassium: 4 mmol/L
SGOT(AST): 21 U/L
SGPT (ALT): 12 U/L — ABNORMAL LOW
SODIUM: 138 mmol/L
TOTAL PROTEIN: 7.6 g/dL

## 2014-06-19 LAB — CBC CANCER CENTER
BASOS ABS: 0.1 x10 3/mm (ref 0.0–0.1)
Basophil %: 1.3 %
Eosinophil #: 0.1 x10 3/mm (ref 0.0–0.7)
Eosinophil %: 2.1 %
HCT: 34.2 % — ABNORMAL LOW (ref 35.0–47.0)
HGB: 11.7 g/dL — ABNORMAL LOW (ref 12.0–16.0)
LYMPHS PCT: 17.7 %
Lymphocyte #: 1.3 x10 3/mm (ref 1.0–3.6)
MCH: 30.4 pg (ref 26.0–34.0)
MCHC: 34 g/dL (ref 32.0–36.0)
MCV: 89 fL (ref 80–100)
Monocyte #: 0.7 x10 3/mm (ref 0.2–0.9)
Monocyte %: 10 %
Neutrophil #: 4.9 x10 3/mm (ref 1.4–6.5)
Neutrophil %: 68.9 %
PLATELETS: 180 x10 3/mm (ref 150–440)
RBC: 3.84 10*6/uL (ref 3.80–5.20)
RDW: 20.9 % — AB (ref 11.5–14.5)
WBC: 7.1 x10 3/mm (ref 3.6–11.0)

## 2014-06-19 LAB — MAGNESIUM: MAGNESIUM: 1.5 mg/dL — AB

## 2014-06-19 NOTE — Op Note (Signed)
PATIENT NAME:  Zoe [name], Zoe [name] MR#:  [id number] DATE OF BIRTH:  03-11-1960  DATE OF PROCEDURE:  10/27/2011  PREOPERATIVE DIAGNOSIS: Adenocarcinoma of the endometrium.   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   PROCEDURES PERFORMED: Exploratory laparotomy with radical hysterectomy, bilateral salpingo-oophorectomy, pelvic lymphadenectomy, and periaortic lymph node sampling.   CO-SURGEONS: Weber Cooks, M.D. and [name]. Barnett Applebaum, M.D.   ANESTHESIA: General.   ESTIMATED BLOOD LOSS: 150 mL.   COMPLICATIONS: None.   INDICATION FOR SURGERY: Ms. Stenseth is a 54 year old patient who presented with irregular uterine bleeding and on D`C was noted to have a grade 2 endometrioid adenocarcinoma. Treatment options were discussed and the decision was made to proceed with surgery.   FINDINGS AT TIME OF SURGERY: Inspection and palpation of the upper abdomen within normal limits. Scars between omentum and abdominal wall related to previous cholecystectomy. Uterus of normal form and size. Both adnexa normal. Slightly enlarged pelvic lymph nodes. No enlarged periaortic lymph nodes. No peritoneal lesions.   OPERATIVE REPORT: After adequate general anesthesia had been obtained, the patient was prepped and draped in supine position. A midline incision was placed with a sharp knife and carried down through the fascia. The peritoneum was identified and entered. The incision was extended cephalad and caudad. Exploration was performed with the above-mentioned findings. Cytology was taken. A Bookwalter retractor was placed and the bowel was packed out of the pelvis.  First the hysterectomy with bilateral salpingo-oophorectomy was done. This part is dictated by Dr. Kenton Kingfisher. Attention was then directed towards the pelvic lymphadenectomy which was done in a similar fashion on the right and left side. Using the Harmonic scalpel, the node bearing fatty tissue from the lower common iliac  artery around the external iliac vessels, down to the circumflex iliac vein, around the hypogastric vessels, and from the obturator space were removed. Vessels, ureter and obturator nerve were kept under constant visualization. Hemostasis was noted to be adequate at the end of the procedure.  Access to the periaortic area was difficult due to the patient's obesity. The peritoneum over the aorta was incised. The node bearing fatty tissue around the IMA was mobilized using the Harmonic scalpel and removed. Hemostasis was noted to be adequate. Arista was placed over the areas of dissection. An omental biopsy was taken using the Harmonic scalpel. Hemostasis was noted to be adequate. Inspection of the pelvis likewise revealed adequate hemostasis. Arista was again placed in the areas of nodal dissection.   Lap sponges and retractors were removed. The fascia was closed with a running loop #1 PDS suture. Irrigation of the subcutaneous tissue was performed before adequate hemostasis was confirmed. The subcutaneous tissue was then reapproximated. The skin was closed with staples. The patient tolerated the procedure well and was taken to the recovery room in satisfactory condition. Postoperative urine was clear. Pad, sponge, needle, and instrument counts were correct x2.   ____________________________ Weber Cooks, MD bem:ap D: 10/27/2011 14:23:36 ET T: 10/27/2011 15:08:47 ET JOB#: [id number]  cc: Weber Cooks, MD, <Dictator> Weber Cooks MD ELECTRONICALLY SIGNED 10/27/2011 16:21

## 2014-06-19 NOTE — Op Note (Signed)
PATIENT NAME:  Zoe [name], Zoe [name] MR#:  [id number] DATE OF BIRTH:  1960/05/06  DATE OF PROCEDURE:  10/27/2011  PREOPERATIVE DIAGNOSIS: Endometrial carcinoma.   POSTOPERATIVE DIAGNOSIS: Endometrial carcinoma.  PROCEDURES: Radical hysterectomy, pelvic lymphadenectomy, paraaortic lymph node sampling.  SURGEON: Glean Salen, MD  CO-SURGEON: Jacquelyne Balint, MD   ANESTHESIA: General.   ESTIMATED BLOOD LOSS: 250 mL.   COMPLICATIONS: None.   FINDINGS: Frozen section analysis reveals 8 cm invasive endometrial carcinoma.   DISPOSITION: To the recovery room in stable condition.   TECHNIQUE: The patient is prepped and draped in the usual sterile fashion after adequate anesthesia is obtained in the supine position on the operating room table with a Foley catheter in place. Scalpel is used to create a midline vertical skin incision from just above the pubic symphysis up to approximately 3 cm above the umbilicus circumventing the umbilicus. The incision is carried down to the level of the rectus fascia which is then incised in a superior and inferior fashion. The midline is identified and the rectus muscles are separated. Peritoneum is penetrated and careful dissection of the peritoneum is performed with inferior retraction of the bladder as needed. A Bookwalter retractor is placed for anterior abdominal retraction. The patient is placed in Trendelenburg positioning. The uterus is identified along with the adnexa. The bowel is run and examined with no obvious signs of disease or spread. The omentum is also identified. Pelvic washings are obtained.   The uterus is grasped. The round ligaments are carefully suture ligated with Vicryl suture. The round ligament is then dissected and the anterior and posterior leaves of the broad ligaments are also dissected to the level of the infundibulopelvic blood vessels and their ligaments. These are then clamped, transected, and suture ligated with excellent hemostasis  noted. On palpation and examination of the ureters reveals that they are out of harms way. The adnexa is retained with the uterine specimen. Dissection is carried down to the level of the uterine arteries which are carefully clamped, transected, and suture ligated using Vicryl sutures. Dissection is then carried to level of the external os and then clamping is performed with complete amputation of uterus and cervix with the adnexa attached. Interrupted Vicryl sutures are placed along the vaginal cuff for complete closure. The pelvic cavity is irrigated with aspiration of all fluid. Again excellent hemostasis is noted.   Pelvic lymphadenectomy and paraaortic lymph node sampling is then performed. Please see separate dictation by Dr. Jacquelyne Balint for more details.   Once the lymph node dissections are complete, the patient is leveled. A partial omentectomy is performed with this sent to pathology for further review as well. Excellent hemostasis is noted at the omentectomy site. The peritoneum and the rectus fascia is identified and the rectus fascia is closed using a #1 PDS suture. Subcutaneous tissues are irrigated and hemostasis is assured using electrocautery. A subcutaneous layer of plain gut suture is placed followed by skin closure using surgical staples. An Aquagel bandage is placed over the incision. The Foley catheter is left in place. The patient goes to the recovery room in stable condition. All sponge, instrument, and needle counts are correct.  ____________________________ [name]. Barnett Applebaum, MD rph:slb D: 10/27/2011 12:08:47 ET T: 10/27/2011 12:16:24 ET JOB#: [id number]  cc: Glean Salen, MD, <Dictator> Gae Dry MD ELECTRONICALLY SIGNED 10/27/2011 15:16

## 2014-06-19 NOTE — Op Note (Signed)
PATIENT NAME:  Zoe [name], Zoe [name] MR#:  [id number] DATE OF BIRTH:  September 16, 1960  DATE OF PROCEDURE:  12/17/2011  PREOPERATIVE DIAGNOSIS: Endometrial cancer.  POSTOPERATIVE DIAGNOSIS: Endometrial cancer.  PROCEDURE: 1. Ultrasound guidance for vascular access, left jugular vein.  2. Fluoroscopic guidance for placement of catheter.  3. Placement of a CT-compatible left jugular Port-A-Cath.   SURGEON: Algernon Huxley, M.D.   ANESTHESIA: Local with moderate conscious sedation.   ESTIMATED BLOOD LOSS: Approximately 25 mL.   FLUOROSCOPY TIME: Approximately one minute.   CONTRAST USED: None.   INDICATION FOR PROCEDURE: This is a 54 year old white female with endometrial cancer. She needs a Port-A-Cath for chemotherapy.   DESCRIPTION OF PROCEDURE: The patient was brought to the vascular and interventional radiology suite and her left neck and chest were sterilely prepped and draped and a sterile surgical field was created.  The left jugular vein was visualized with ultrasound and found to be patent but small. For this reason, a micropuncture needle was used to access the left jugular vein. This was done without difficulty with a micropuncture needle under direct ultrasound guidance. A permanent image was recorded. Micropuncture wire and sheath were placed. We then up-sized to a 0.035 sheath and parked a peel-away sheath down into the vein. I then anesthetized an area two fingerbreadths below the left clavicle and a transverse incision was created. An inferior pocket was created with electrocautery and blunt dissection and the port was secured with 2 Prolene sutures to the chest wall. The catheter was connected to the port and tunneled from the subclavicular incision to the access site. Fluoroscopic guidance was used to cut the catheter to an appropriate length. It was then placed through the peel-away sheath and the peel-away sheath was removed.  The catheter tip was found to be in good location in the superior  vena cava. It withdrew blood well and flushed easily with heparinized saline. The wound was irrigated with antibiotic-impregnated saline and closed with a running 3-0 Vicryl and 4-0 Monocryl. The access incision was closed with a single 4-0 Monocryl. Dermabond was placed as a dressing. The patient tolerated the procedure well and was taken to the recovery room in stable condition.       ____________________________ Algernon Huxley, MD jsd:bjt D: 12/17/2011 10:27:25 ET T: 12/17/2011 11:14:11 ET JOB#: [id number]  cc: Algernon Huxley, MD, <Dictator> Algernon Huxley MD ELECTRONICALLY SIGNED 12/21/2011 13:10

## 2014-06-19 NOTE — Op Note (Signed)
PATIENT NAME:  Zoe [name], Zoe [name] MR#:  [id number] DATE OF BIRTH:  1960/06/25  DATE OF PROCEDURE:  09/25/2011  PREOPERATIVE DIAGNOSIS: Abnormal uterine bleeding.   POSTOPERATIVE DIAGNOSIS: Abnormal uterine bleeding.  PROCEDURE PERFORMED: Fractional dilatation and curettage.   SURGEON: Glean Salen, M.D.   ANESTHESIA: General.   ESTIMATED BLOOD LOSS: Minimal.   COMPLICATIONS: None.   SPECIMEN: Endocervical curetting, endometrial curetting.   DISPOSITION: To recovery room stable.   TECHNIQUE: The patient is prepped and draped in the usual sterile fashion after adequate anesthesia is obtained, in the dorsal lithotomy position. A speculum is placed and the cervix is grasped with a tenaculum. The uterus is sounded to 9 cm. An endocervical specimen is obtained with a Kevorkian curette. The cervix is then dilated to a size 20 Pratt dilator and then an endometrial curettage is performed with a banjo curette. Copious amount specimen is obtained at this time. Hemostasis is noted when done with the curettage procedure. The tenaculum is removed and the patient goes to the recovery room in stable condition. All sponge, instrument, and needle counts are correct. ____________________________ [name]. Barnett Applebaum, MD rph:slb D: 09/25/2011 14:36:41 ET     T: 09/25/2011 15:16:34 ET         JOB#: [id number] cc: Glean Salen, MD, <Dictator> Gae Dry MD ELECTRONICALLY SIGNED 09/26/2011 6:58

## 2014-06-20 LAB — CA 125: CA 125: 20 U/mL (ref 0.0–34.0)

## 2014-06-22 NOTE — Consult Note (Signed)
Reason for Visit: This 54 year old Female patient presents to the clinic for initial evaluation of  endometrial carcinoma .   Referred by Dr. Oliva Bustard.  Diagnosis:   Chief Complaint/Diagnosis   54 year old female status post TAH/BSO for stage IIIc(T2, N2, M0) high-grade endometrial adenocarcinomaon adjuvant chemotherapy   Pathology Report pathology report reviewed    Imaging Report PET/CT scan reviewed    Referral Report clinical notes reviewed    Planned Treatment Regimen adjuvant whole pelvis radiation plus intracavitary vaginal brachytherapy boost    HPI   patient is a 54 year old female who is noted by her PMD to have an enlarged endometrium. [place] he underwent D&C positive for moderately differentiated adenocarcinoma. Was seen by Dr. Sabra Heck and went on to have a TAH/BSO with pelvic lymph node dissection showing an 8 cm high-grade FIGO grade 3 endometrial adenocarcinoma. There was greater than 50% myometrial invasion with just less than 1 mm to the serosal surface. 4 of 19 pelvic nodes were positive for metastatic disease in one of 2 periaortic lymph nodes were positive. There was invasion of the cervical stroma. Patient was seen by medical oncology and has been initiated on carboplatinum and Taxol chemotherapy in October 2013. She is here to initiate her fourth cycle of chemotherapy. She is doing fairly well.she is having no vaginal bleeding or pelvic pain. She specifically denies diarrhea dysuria or any other GI GU complaints.  Past Hx:    endometerial cancer:    Neuropathy:    Hemorrhoids:    Arthritis:    Anxiety:    Insomnia:    Hypertension:    Kyphosis:    PUD:    LE edema:    COPD:    depression:    smoker:    Asthma:    Panic Attacks:    Diabetes:    Shoulder surgery on right:    Cataract Extraction: Right eye, 2010   Tonsillectomy and Adenoidectomy:    Gall Bladder:   Past, Family and Social History:   Past Medical History positive     Cardiovascular hypertension    Respiratory asthma; COPD    Endocrine diabetes mellitus    Neurological/Psychiatric depression; panic attacks    Past Surgical History cholecystectomy; tonsillectomy remotely, cataract surgery, right shoulder surgery    Past Medical History Comments lower extremity edema, kyphosis, insomnia, arthritis    Family History positive    Family History Comments family history of adult onset diabetes, hyperlipidemia, hypertension, cardia vascular heart disease, bladder cancer and leukemia    Social History positive    Social History Comments greater than 50-pack-year smoking history no EtOH abuse history    Additional Past Medical and Surgical History seen by herself today   Allergies:   Codeine: GI Distress, Other  ASA: GI Distress, Other  Erythromycin: Rash, GI Distress  Novocain: Swelling, Rash  Ibuprofen: GI Distress  Aspirin: N/V/Diarrhea, GI Distress  Home Meds:  Home Medications: Medication Instructions Status  ondansetron 8 mg oral tablet 1 tab(s) orally 3 times a day, As Needed Active  Advair Diskus powder 250 mcg-50 mcg 1 puff(s)  2 times a day  Active  quinapril 10 mg oral tablet 1 tab(s) orally once a day (in the morning) Active  furosemide 20 mg oral tablet 1 tab(s) orally once a day in am Active  ProAir HFA 2 puffs inhaled QID prn Active  metformin 1000 mg oral tablet 1 tab(s) orally 2 times a day Active  Lantus Solostar Pen 100 units/mL subcutaneous solution 46 unit(s)  subcutaneous once a day (in the morning) Active  fluoxetine 20 mg oral capsule 1 cap(s) orally once a day (in the morning) Active  Neurontin 600 mg oral tablet 1 tab(s) orally 3 times a day Active  Tylenol Caplet Extra Strength 500 mg oral tablet 1 tab(s) orally 3 times a day, As Needed- for Pain  Active   Review of Systems:   General negative    Performance Status (ECOG) 0    Skin negative    Breast negative    Ophthalmologic negative    ENMT negative     Respiratory and Thorax negative    Cardiovascular negative    Gastrointestinal negative    Genitourinary see HPI    Musculoskeletal negative    Neurological negative    Psychiatric see HPI    Hematology/Lymphatics negative    Endocrine negative    Allergic/Immunologic negative    Review of Systems   according to the nurse's notesPatient denies any weight loss, fatigue, weakness, fever, chills or night sweats. Patient denies any loss of vision, blurred vision. Patient denies any ringing  of the ears or hearing loss. No irregular heartbeat. Patient denies heart murmur or history of fainting. Patient denies any chest pain or pain radiating to her upper extremities. Patient denies any shortness of breath, difficulty breathing at night, cough or hemoptysis. Patient denies any swelling in the lower legs. Patient denies any nausea vomiting, vomiting of blood, or coffee ground material in the vomitus. Patient denies any stomach pain. Patient states has had normal bowel movements no significant constipation or diarrhea. Patient denies any dysuria, hematuria or significant nocturia. Patient denies any problems walking, swelling in the joints or loss of balance. Patient denies any skin changes, loss of hair or loss of weight. Patient denies any excessive worrying or anxiety or significant depression. Patient denies any problems with insomnia. Patient denies excessive thirst, polyuria, polydipsia. Patient denies any swollen glands, patient denies easy bruising or easy bleeding. Patient denies any recent infections, allergies or URI. Patient "s visual fields have not changed significantly in recent time.  Nursing Notes:  Nursing Vital Signs and Chemo Nursing Nursing Notes: *CC Vital Signs Flowsheet:   09-Jan-14 08:27   Temp Temperature 98.2   Pulse Pulse 101   Respirations Respirations 22   DBP DBP 63   Pain Scale (0-10)  7 Legs and Feet   Current Weight (kg) (kg) 92.3   Height (cm) centimeters  162   BSA (m2) 1.9   Physical Exam:  General/Skin/HEENT:   General normal    Skin normal    Eyes normal    ENMT normal    Head and Neck normal    Additional PE well-developed well-nourished obese female in NAD. Lungs are clear to A&P cardiac examination shows regular rate and rhythm. Abdomen is benign with no organomegaly or masses noted. On speculum examination vaginal vault is clear. No evidence of mass in the vaginal apex is noted. Bimanual examination shows no evidence of mass or nodularity no parametrial. Rectal exam is within normal limits. She does have +2 edema in her lower extremities bilaterally.   Breasts/Resp/CV/GI/GU:   Respiratory and Thorax normal    Cardiovascular normal    Gastrointestinal normal    Genitourinary normal   MS/Neuro/Psych/Lymph:   Musculoskeletal normal    Neurological normal    Lymphatics normal   Assessment and Plan:  Impression:   stage IIIc endometrial adenocarcinoma FIGO grade 30 and 54 year old female  Plan:   although adjuvant radiation  combined with chemotherapy in the adjuvant setting is somewhat controversial I would treat according to GOG protocol based on her poor prognostic factors including spread to the serosal surface, positive pelvic lymph nodal metastasis, positive cervical stromal invasion. I recommended going ahead with whole pelvic radiation to 4500 cGy and then boost using high-dose rate remote afterloading her vaginal apex to 1200 gray in 3 fractions. Risks and benefits of treatment including GI and GU toxicity his, skin reaction, alteration of blood counts were all explained in detail to the patient. She is having her last cycle of chemotherapy today. I have set up CT simulation for the middle of next week. Patient also requests a hospital bed today which we have written a prescription for.  I would like to take this opportunity to thank you for allowing me to continue to participate in this patient's care.  CC  Referral:   cc: Dr. Maurine Minister, Dr. Sonia Baller   Electronic Signatures: Baruch Gouty Roda Shutters (MD)  (Signed 09-Jan-14 11:31)  Authored: HPI, Diagnosis, Past Hx, PFSH, Allergies, Home Meds, ROS, Nursing Notes, Physical Exam, Encounter Assessment and Plan, CC Referring Physician   Last Updated: 09-Jan-14 11:31 by Armstead Peaks (MD)

## 2014-06-22 NOTE — Op Note (Signed)
PATIENT NAME:  Zoe [name], Zoe [name] MR#:  [id number] DATE OF BIRTH:  1960-04-28  DATE OF PROCEDURE:  08/24/2012  PREOPERATIVE DIAGNOSIS: Endometrial cancer with no evidence of disease, status post chemotherapy and previous port placement.   POSTOPERATIVE DIAGNOSIS:  Endometrial cancer with no evidence of disease, status post chemotherapy and previous port placement.   PROCEDURE:  Removal of Port-A-Cath.   SURGEON:  Algernon Huxley, MD  ANESTHESIA:  Local with sedation.   BLOOD LOSS:  Minimal.   INDICATION FOR PROCEDURE: A 54 year old female who has completed her chemotherapy treatment. She currently has no evidence for endometrial disease, and desires to have her port removed.   DESCRIPTION OF PROCEDURE:  The patient's left chest was sterilely prepped and draped, and a sterile surgical field was created. The area was anesthetized, and the previous port incision was reopened. The port was then dissected out. The 2 Prolene sutures securing the port to the chest wall were divided, and the port was removed in its entirety. Pressure was held over the catheter site, and the catheter was removed in its entirety. The wound was then closed with 3-0 Vicryl and 4-0 Monocryl. Dermabond was placed as a dressing.      ____________________________ Algernon Huxley, MD jsd:mr D: 08/24/2012 15:50:48 ET T: 08/24/2012 20:53:50 ET JOB#: [id number]  cc: Algernon Huxley, MD, <Dictator> Algernon Huxley MD ELECTRONICALLY SIGNED 08/25/2012 14:45
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
## 2014-06-25 LAB — SURGICAL PATHOLOGY

## 2014-06-26 LAB — CBC CANCER CENTER
Basophil #: 0 x10 3/mm (ref 0.0–0.1)
Basophil %: 0.6 %
EOS PCT: 3.8 %
Eosinophil #: 0.2 x10 3/mm (ref 0.0–0.7)
HCT: 32.7 % — ABNORMAL LOW (ref 35.0–47.0)
HGB: 11.2 g/dL — ABNORMAL LOW (ref 12.0–16.0)
Lymphocyte #: 1.3 x10 3/mm (ref 1.0–3.6)
Lymphocyte %: 28.6 %
MCH: 30.9 pg (ref 26.0–34.0)
MCHC: 34.1 g/dL (ref 32.0–36.0)
MCV: 91 fL (ref 80–100)
MONOS PCT: 5.6 %
Monocyte #: 0.3 x10 3/mm (ref 0.2–0.9)
Neutrophil #: 2.7 x10 3/mm (ref 1.4–6.5)
Neutrophil %: 61.4 %
Platelet: 140 x10 3/mm — ABNORMAL LOW (ref 150–440)
RBC: 3.61 10*6/uL — ABNORMAL LOW (ref 3.80–5.20)
RDW: 22 % — ABNORMAL HIGH (ref 11.5–14.5)
WBC: 4.5 x10 3/mm (ref 3.6–11.0)

## 2014-06-29 ENCOUNTER — Other Ambulatory Visit: Payer: Self-pay | Admitting: *Deleted

## 2014-06-29 DIAGNOSIS — C541 Malignant neoplasm of endometrium: Secondary | ICD-10-CM

## 2014-07-01 NOTE — Op Note (Signed)
PATIENT NAME:  Zoe [name], Zoe [name] MR#:  [id number] DATE OF BIRTH:  [birth year]  DATE OF PROCEDURE:  04/12/2014  PREOPERATIVE DIAGNOSIS: Ovarian cancer with poor venous access.   POSTOPERATIVE DIAGNOSIS: Ovarian cancer with poor venous access.   PROCEDURES:  1.  Ultrasound guidance for vascular access, right internal jugular vein.  2.  Fluoroscopic guidance for placement of catheter.  3.  Placement of CT compatible Port-A-Cath, right internal jugular vein.   SURGEON:  Algernon Huxley, MD  ANESTHESIA:  Local with moderate conscious sedation.   FLUOROSCOPY TIME:  Less than 1 minute.   CONTRAST:  Zero.   ESTIMATED BLOOD LOSS:  25 mL.  INDICATION FOR PROCEDURE: This is a 54 year old female with recurrent GYN malignancy who needs a Port-A-Cath for chemotherapy and durable venous access. We are asked to place this by the oncology service.   DESCRIPTION OF THE PROCEDURE:  The patient was brought to the vascular and interventional radiology suite. The right neck and chest were sterilely prepped and draped, and a sterile surgical field was created. Ultrasound was used to help visualize a patent right internal jugular vein. This was then accessed under direct ultrasound guidance without difficulty with a Seldinger needle and a permanent image was recorded. A J-wire was placed. After skin nick and dilatation, the peel-away sheath was then placed over the wire. I then anesthetized an area under the clavicle approximately 2 fingerbreadths. A transverse incision was created and an inferior pocket was created with electrocautery and blunt dissection. The port was then brought onto the field, placed into the pocket and secured to the chest wall with 2 Prolene sutures. The catheter was connected to the port and tunneled from the subclavicular incision to the access site. Fluoroscopic guidance was used to cut the catheter to an appropriate length. The catheter was then placed through the peel-away sheath and the  peel-away sheath was removed. The catheter tip was parked in excellent location in the cavoatrial. The pocket was then irrigated with antibiotic-impregnated saline and the wound was closed with a running 3-0 Vicryl and a 4-0 Monocryl. The access incision was closed with a single 4-0 Monocryl. The Huber needle was used to withdraw blood and flush the port with heparinized saline. Dermabond was then placed as a dressing. The patient tolerated the procedure well and was taken to the recovery room in stable condition.    ____________________________ Algernon Huxley, MD jsd:LT D: 04/16/2014 16:31:01 ET T: 04/16/2014 16:56:49 ET JOB#: [id number]  cc: Algernon Huxley, MD, <Dictator> Algernon Huxley MD ELECTRONICALLY SIGNED 04/25/2014 16:36

## 2014-07-01 NOTE — Consult Note (Signed)
Note Type Consult   HPI: This 54 year old Female patient presents to the clinic for follow up  Ovarian Cancer .  Chief Complaint:  Historian Patient   Presenting Problem Patient here today for ongoing follow up of ovarian cancer and CT guided biopsy results.. Patient reports pain 9/10 from procedure yesterday.   Negative Symptoms fever, chills, anorexia, weakness, nausea, vomiting, diarrhea, constipation, cough, shortness of breath, palpitations, burning with urination, urinary frequency, headache, numbness/tingling, back pain, hip pain, rash   Subjective: Chief Complaint/Diagnosis:   1. Endometroid adenocarcinoma, stage IIIC, grade 3, TAHBSO and complete staging. 2. Carboplatin and paclitaxel x 3.  XRT, treatments delayed due to multiple factors. 3. CT 03/20/14 Findings are compatible with widespread metastatic disease.Specifically, there appears to be local recurrence of disease in the low anatomic pelvis with and ill-defined amorphous soft tissue mass extending from the right side of the vaginal apex to the pelvic sidewall, causing some mild obstruction at the junction of distal and middle third of the right ureter (with mild proximal right hydroureteronephrosis), widespread omental and intraperitonealretroperitoneal lymphadenopathy and new pulmonary nodules, as detailed above.February 2016, Biopsy of abdominal mass is positive for malignancy consistent with endometrial cancer. 5. Started Carboplatin AUC 5 and Taxol 142m/m2, 04/17/2014. HPI:   54year old lady with recurrent endometrial cancer here for second cycle of chemotherapy.  No chills fever.  Alopecia.  Tolerated first treatment with mild nausea grade 1.  No stomatitis.  No diarrhea.  Neuropathy is intermittent.  No abdominal pain.   Review of Systems:  General: fatigue  Performance Status (ECOG): 1  HEENT: no complaints  Lungs: no complaints  Cardiac: no complaints  GI: no complaints  GU: no complaints  Musculoskeletal: no  complaints  Extremities: no complaints  Skin: no complaints  Neuro: numbness/tingling  Psych: anxiety  Pain ?: No complaints (0, none)  Review of Systems: All other systems were reviewed and found to be negative  Review of Systems:   As per HPI. Otherwise, 10 point system review was negative.   Allergies:  Codeine: GI Distress, Other  ASA: GI Distress, Other  Erythromycin: Rash, GI Distress  Novocain: Swelling, Rash  Ibuprofen: GI Distress  Aspirin: N/V/Diarrhea, GI Distress  Significant History/PMH:   endometerial cancer:    Neuropathy:    Hemorrhoids:    Arthritis:    Anxiety:    Insomnia:    Hypertension:    Kyphosis:    PUD:    LE edema:    COPD:    depression:    smoker:    Asthma:    Panic Attacks:    Diabetes:    Shoulder surgery on right:    Cataract Extraction: Right eye, 2010   Tonsillectomy and Adenoidectomy:    Gall Bladder:   Preventive Screening:  Has patient had any of the following test? Pap Smear   Last Pap Smear: 2013   Smoking History: Smoking History 1 Packs per day and smoked for 45 years. Smoking Cessation Information Given to Patient .  PFSH: Additional Past Medical and Surgical History: Past Medical History/Past Surgical History -  Diabetes mellitus  Chronic obstructive pulmonary disease  Allergic rhinitis  Depression  Chronic smoker  Chronic leukocytosis, thrombocytosis, erythrocytosis  Proteinuria, on Accupril started in 2003  Venous stasis  Peripheral neuropathy  Stomach ulcer 1980 with GI bleed  Arthritis right shoulder  Cholecystectomy  Anxiety/depression  Cataracts    Family History - remarkable for diabetes, hyperlipidemia, hypertension, heart disease, bladder cancer and leukemia.  Social History - chronic smoker 45-pack-years, ongoing.  Denies alcohol or recreational drug usage.  Physically active and ambulatory.   Home Medications: Medication Instructions Last Modified Date/Time   cyclobenzaprine 5 mg oral tablet 1 tab(s) orally 3 times a day, As Needed 08-Mar-16 10:30  acetaminophen-HYDROcodone 325 mg-5 mg oral tablet 1 tab(s) orally 4 times a day (with meals and at bedtime), As Needed - for Pain 08-Mar-16 10:30  ondansetron 4 mg oral tablet 1 tab(s) orally every 4 hours, As Needed - for Nausea, Vomiting 08-Mar-16 10:30  Neurontin 600 mg oral tablet 1 tab(s) orally 4 times a day 08-Mar-16 10:30  ProAir HFA 2 puffs inhaled QID prn 08-Mar-16 10:30  Lantus Solostar Pen 100 units/mL subcutaneous solution 46 unit(s) subcutaneous once a day (in the morning) 08-Mar-16 10:30  Tylenol Caplet Extra Strength 500 mg oral tablet 1 tab(s) orally 3 times a day, As Needed- for Pain  08-Mar-16 10:30  Lasix 40 mg oral tablet 1 tab(s) orally once a day 08-Mar-16 10:30  Advair Diskus 250 mcg-50 mcg inhalation powder 1 puff(s) inhaled 2 times a day 08-Mar-16 10:30  simvastatin 20 mg oral tablet 1 tab(s) orally once a day (at bedtime) 08-Mar-16 10:30  metFORMIN 1000 mg oral tablet 1 tab(s) orally 2 times a day 08-Mar-16 10:30  traZODone 50 mg oral tablet  orally once a day (at bedtime) 08-Mar-16 10:30  lisinopril 10 mg oral tablet 1 tab(s) orally once a day 08-Mar-16 10:30  Tylenol Arthritis Caplet   once a day 08-Mar-16 10:30   Vital Signs:  :: Wt(KG): 93.5 Temp: 95.4 Pulse: 97 RR: 16  BP: 114/75   Physical Exam:  General: Patient is alert oriented not in any acute distress  Mental Status: alert and oriented to person, place and time  Head, Ears, Nose,Throat: alopecia  No  evidence of stomatitis  Neck, Thyroid: no thyroid tenderness, enlargement or nodule.  neck supple without massess or tenderness. no adenopathy.  Respiratory: no rales, rhonchi, retractions or wheezing. Emphysematous chest.  Cardiovascular: regular rate and rhythm, no murmur, rub or gallop  Breast: not examined  Gastrointestinal: soft, non tender, no masses and normal bowel sounds  Musculoskeletal: no muscular  asymmetry noted.  no swelling or tenderness of joints.  ROM upper and lower extremities normal  Skin: no rashes, ulcers, or lesions  Neurological: normal motor exam, gait, 5 x 5, strength, grip, pressure, flexor, extensor  Lymphatics: no cervical, axillary, or inguinal lymphadenopathy   Lab Results Review:  Lab Results   CBC  :  17-Apr-2014 09:01:  Last Resulted Date/Time.8.4 Hgb: 14.0 Hct: 42.6 Plat: 321 MCV: 87 ANC: 6.1 . Metabolic Panel  :  [id number] 09:01:  Last Resulted Date/Time.135(L) K+: 4.0 CO2: 29 Chl: 97(L) BUN: 18 Cre: 0.71 Glu:239(H) Calcium: 8.8 Alk Phos: 136(H) ALT: 12(L) AST: 8(L) T.Prot: 7.3 Alb: 2.8(L) eGFR-AA: >60 eGFR: >60 . 125  :  17-Apr-2014 09:01:  Last Resulted Date/Time.125: 52.6(H) .  CA 125 DATE: 19-Jul-2012  RESULT: 6.3 CA 125 DATE: 17-Apr-2014  RESULT: 52.6  CA 125 DATE: 14-Mar-2014  RESULT: 89.7  CA 125 DATE: 17-Apr-2014  RESULT: 52.65 Results: CA 125: 52.6 (H): 89.7 (H): 7.8: 6.3   Review Pathology Report: Pathology Reports:   Pathology Report:  (03-Apr-2014) PRE-OPERATIVE DIAGNOSIS:information attached on separate pages  DIAGNOSIS:provided     MASS; [place] OF FIBROTIC/SCLEROTIC SOFT TISSUE FRAGMENTS BY MODERATELYADENOCARCINOMA, SEE COMMENT. stains performed on formalin fixed and paraffintissue sections reveal that the adenocarcinoma is reactive toreceptor and cytokeratin subtype 7.  It is non-reactive tosubtype 20.  The controls stain appropriately. results support the histologic interpretation of an adenocarcinomaare consistent with a metastasis/recurrence from the [place]. has been submitted for HER2 analysis and an addendum report willissued of these results.     Orders: Chemotherapy Orders:    1 CARBOplatin injection                    Order date: null  Cycle:2, 750 mg, IV Piggyback, once, 1150 ml/hr, 30 minute(s) 2 PACLitaxel injection                     Order date: null  Cycle:2, 390 mg, IV  Piggyback, once, 188.33 ml/hr, 180 minute(s)  Assessment and Plan: Impression:   1. Endometrial cancer. endometrial cancer.  Tolerating chemotherapy very well will proceed with second cycle of chemotherapy.  Minimal side effect with alopecia and grade 1 nausea lasting for 2 days. Plan:   1. Recurrent Endometrial cancer. Stage III, Biopsy of abdominal mass consistent with recurrent endometrial cancer. Patient had PAC placed yesterday. Having some tenderness at [facility] site. Also having pain and stiffness in neck. WIll start flexeril for neck discomfort. proceed with cycle 2 day 1 chemotherapy with carboplatinum with AUC of 5 and Taxol with 175 mg/m every 3 weeks. monitor cbc weekly. Neulast may be required for future cycles.  continue with follow up in 3 weeks.understands she can return to clinic at any time if she has any questions, concerns, or complaints.   Tumor Staging:  Tumor Staging Tumor Staging   Tumor T3   Node N1   Metastasis M1   Stage IV   Electronic Signatures: Emeril Stille, Martie Lee (MD)  (Signed 10-Mar-16 08:16)  Authored: Note Type, History of Present Illness, CC/HPI, Review of Systems, ALLERGIES, PAST MEDICAL HISTORY, Preventive Screening, Smoking Cessation, Patient Family Social History, HOME MEDICATIONS, Vital Signs, Physical Exam, Lab Results Review, Pathology Report Review, Chemo Orders, Assessment and Plan, Quality Measures   Last Updated: 10-Mar-16 08:16 by Jobe Gibbon (MD)

## 2014-07-01 NOTE — Consult Note (Signed)
Note Type Consult   HPI: This 54 year old Female patient presents to the clinic for follow up  endometrial (1)  Chief Complaint:  Zoe [name] Patient  Family (1)   Presenting Problem The patient presents to clinic today for treatment for endometrial cancer.(1)   Positive Symptoms fever, chills, weakness, nausea, diarrhea, cough, shortness of breath, numbness/tingling, back pain(1)   Negative Symptoms anorexia, vomiting, constipation, palpitations, burning with urination, urinary frequency, headache, hip pain, rash(1)   Other Symptoms has been having tremors, shaking uncontrollably, loss of control of extremeties.(1)   Subjective: Chief Complaint/Diagnosis:   1. Endometroid adenocarcinoma, stage IIIC, grade 3, TAHBSO and complete staging. 2. Carboplatin and paclitaxel x 3.  XRT, treatments delayed due to multiple factors. 3. CT 03/20/14 Findings are compatible with widespread metastatic disease.Specifically, there appears to be local recurrence of disease in the low anatomic pelvis with and ill-defined amorphous soft tissue mass extending from the right side of the vaginal apex to the pelvic sidewall, causing some mild obstruction at the junction of distal and middle third of the right ureter (with mild proximal right hydroureteronephrosis), widespread omental and intraperitonealretroperitoneal lymphadenopathy and new pulmonary nodules, as detailed above.February 2016, Biopsy of abdominal mass is positive for malignancy consistent with endometrial cancer. 5. Started Carboplatin AUC 5 and Taxol 158m/m2, 04/17/2014. HPI:   54year old lady with recurrent endometrial cancer here for second cycle of chemotherapy.  No chills fever.  Alopecia.  Tolerated first treatment with mild nausea grade 1.  No stomatitis.  No diarrhea.  Neuropathy is intermittent.  No abdominal pain.is here for the next cycle of chemotherapy with carboplatinum and Taxol complaints of numbness from head to foot.  Weakness.   Abdominal discomfort.  Lower extremity swelling off and on.  Patient is extremely anxious   Review of Systems:  General: fatigue  Performance Status (ECOG): 1  HEENT: no complaints  Lungs: no complaints  Cardiac: no complaints  GI: no complaints  GU: no complaints  Musculoskeletal: no complaints  Extremities: no complaints  Skin: no complaints  Neuro: numbness/tingling  rade 1  Psych: anxiety  depression  sleep disturbance  Pain ?: No complaints (0, none)  Review of Systems: All other systems were reviewed and found to be negative  Review of Systems:   As per HPI. Otherwise, 10 point system review was negative.   Allergies:  Codeine: GI Distress, Other  ASA: GI Distress, Other  Erythromycin: Rash, GI Distress  Novocain: Swelling, Rash  Ibuprofen: GI Distress  Aspirin: N/V/Diarrhea, GI Distress  Significant History/PMH:   endometerial cancer:    Neuropathy:    Hemorrhoids:    Arthritis:    Anxiety:    Insomnia:    Hypertension:    Kyphosis:    PUD:    LE edema:    COPD:    depression:    smoker:    Asthma:    Panic Attacks:    Diabetes:    Shoulder surgery on right:    Cataract Extraction: Right eye, 2010   Tonsillectomy and Adenoidectomy:    Gall Bladder:   Preventive Screening:  Has patient had any of the following test? Pap Smear (1)   Last Pap Smear: 2013(1)   Smoking History: Smoking History 1(1)Packs per day and smoked fro 45 years(1)Smoking Cessation Information Given to Patient .  PFSH: Additional Past Medical and Surgical History: Past Medical History/Past Surgical History -  Diabetes mellitus  Chronic obstructive pulmonary disease  Allergic rhinitis  Depression  Chronic smoker  Chronic  leukocytosis, thrombocytosis, erythrocytosis  Proteinuria, on Accupril started in 2003  Venous stasis  Peripheral neuropathy  Stomach ulcer 1980 with GI bleed  Arthritis right  shoulder  Cholecystectomy  Anxiety/depression  Cataracts    Family History - remarkable for diabetes, hyperlipidemia, hypertension, heart disease, bladder cancer and leukemia.    Social History - chronic smoker 45-pack-years, ongoing.  Denies alcohol or recreational drug usage.  Physically active and ambulatory.   Home Medications: Medication Instructions Last Modified Date/Time  acetaminophen-HYDROcodone 325 mg-10 mg oral tablet 1 tab(s) orally every 6 hours, As Needed - for Pain 01-Apr-16 15:34  cyclobenzaprine 5 mg oral tablet 1 tab(s) orally 3 times a day, As Needed 01-Apr-16 15:34  ondansetron 4 mg oral tablet 1 tab(s) orally every 4 hours, As Needed - for Nausea, Vomiting 29-Mar-16 10:33  Neurontin 600 mg oral tablet 1 tab(s) orally 4 times a day 29-Mar-16 10:33  ProAir HFA 2 puffs inhaled QID prn 29-Mar-16 10:33  Lantus Solostar Pen 100 units/mL subcutaneous solution 46 unit(s) subcutaneous once a day (in the morning) 01-Apr-16 15:34  Tylenol Caplet Extra Strength 500 mg oral tablet 1 tab(s) orally 3 times a day, As Needed- for Pain  29-Mar-16 10:33  Lasix 40 mg oral tablet 1 tab(s) orally once a day 29-Mar-16 10:33  Advair Diskus 250 mcg-50 mcg inhalation powder 1 puff(s) inhaled 2 times a day 01-Apr-16 15:34  simvastatin 20 mg oral tablet 1 tab(s) orally once a day (at bedtime) 29-Mar-16 10:33  metFORMIN 1000 mg oral tablet 1 tab(s) orally 2 times a day 29-Mar-16 10:33  traZODone 50 mg oral tablet  orally once a day (at bedtime) 29-Mar-16 10:33  lisinopril 10 mg oral tablet 1 tab(s) orally once a day 29-Mar-16 10:33  Tylenol Arthritis Caplet   once a day 29-Mar-16 10:33  omeprazole 20 mg oral delayed release tablet 1 tab(s) orally once a day 01-Apr-16 15:34   Vital Signs:  :: Wt(KG): 93.5 Temp: 96.9 Pulse: 94 RR: 20  BP: 194/93   Physical Exam:  General: Patient is alert oriented not in any acute distress  Mental Status: alert and oriented to person, place and time  Head,  Ears, Nose,Throat: alopecia  No  evidence of stomatitis  Neck, Thyroid: no thyroid tenderness, enlargement or nodule.  neck supple without massess or tenderness. no adenopathy.  Respiratory: no rales, rhonchi, retractions or wheezing. Emphysematous chest.  Cardiovascular: regular rate and rhythm, no murmur, rub or gallop  Breast: not examined  Gastrointestinal: soft, non tender, no masses and normal bowel sounds  Musculoskeletal: no muscular asymmetry noted.  no swelling or tenderness of joints.  ROM upper and lower extremities normal  Skin: no rashes, ulcers, or lesions  Neurological: higher functions are within normal limit.  cranial nerves are intact  Muscle power tone within normal limit.  No focal signs.  No sensory deficit.  no evidence of significant neuropathy  Lymphatics: no cervical, axillary, or inguinal lymphadenopathy   Lab Results Review:  Lab Results   CBC  :  29-May-2014 09:02:  Last Resulted Date/Time.5.1 Hgb: 11.6(L) Hct: 34.0(L) Plat: 138(L) MCV: 87 ANC: 3.4 .  :  08-May-2014 10:53:  Last Resulted Date/Time.135 K+: 3.7 CO2: 30 Chl: 95(L) BUN: 19 Cre: 0.62 Glu:237(H) Calcium: 9.2 . Metabolic Panel  :  [id number] 09:02:  Last Resulted Date/Time.135 K+: 3.3(L) CO2: 28 Chl: 104 BUN: 15 Cre: 0.55 Glu:221(H) Calcium: 8.8(L) Alk Phos: 93 ALT: 13(L) AST: 18 T.Prot: 6.8 Alb: 3.2(L) eGFR-AA: >60 eGFR: >60 .  :  29-May-2014  09:02:  Last Resulted Date/Time.1.2(L) . 125  :  17-Apr-2014 09:01:  Last Resulted Date/Time.125: 52.6(H) .  CA 125 DATE: 19-Jul-2012  RESULT: 6.3 CA 125 DATE: 17-Apr-2014  RESULT: 52.6  CA 125 DATE: 14-Mar-2014  RESULT: 89.7  CA 125 DATE: 17-Apr-2014  RESULT: 52.65 Results: CA 125: 52.6 (H): 89.7 (H): 7.8: 6.3   Review Pathology Report: Pathology Reports:   Pathology Report:  (03-Apr-2014) PRE-OPERATIVE DIAGNOSIS:information attached on separate pages  DIAGNOSIS:provided     MASS; [place] OF FIBROTIC/SCLEROTIC SOFT TISSUE  FRAGMENTS BY MODERATELYADENOCARCINOMA, SEE COMMENT. stains performed on formalin fixed and paraffintissue sections reveal that the adenocarcinoma is reactive toreceptor and cytokeratin subtype 7.  It is non-reactive tosubtype 20.  The controls stain appropriately. results support the histologic interpretation of an adenocarcinomaare consistent with a metastasis/recurrence from the [place]. has been submitted for HER2 analysis and an addendum report willissued of these results.     Orders: Chemotherapy Orders:   1 CARBOplatin injection                    Order date: null  Cycle:2, 750 mg, IV Piggyback, once, 1150 ml/hr, 30 minute(s) PACLitaxel injection                     Order date: null  Cycle:2, 390 mg, IV Piggyback, once, 188.33 ml/hr, 180 minute(s) 1 CARBOplatin injection                    Order date: null  Cycle:3, 750 mg, IV Piggyback, once, 1150 ml/hr, 30 minute(s) 2 PACLitaxel injection                     Order date: null  Cycle:3, 390 mg, IV Piggyback, once, 188.33 ml/hr, 180 minute(s)  Assessment and Plan: Impression:   1. Endometrial cancer. endometrial cancer. has number of complaints including weakness and numbness.  Abdominal discomfort etiology of which is not clear will proceed with the next cycle of chemotherapy Hypomagnesemia patient will get intravenous magnesium as well as oralreevaluation with CA-125 and if needed with a CT scan would be planned and anxiety has been addressed.,  was instructed to increase his insulin dose and check on blood sugar more frequently. Plan:   ....   Tumor Staging:  Tumor Staging Tumor Staging   Tumor T3   Node N1   Metastasis M1   Stage IV   Advance Directive:  Advance Directive ([place]) no(1)   Advance Directive Information Given patient refused(1)   Electronic Signatures: Arran Fessel, Martie Lee (MD)  (Signed 03-Apr-16 15:38)  Authored: Note Type, History of Present Illness, CC/HPI, Review of  Systems, ALLERGIES, PAST MEDICAL HISTORY, Preventive Screening, Smoking Cessation, Patient Family Social History, HOME MEDICATIONS, Vital Signs, Physical Exam, Lab Results Review, Pathology Report Review, Chemo Orders, Assessment and Plan, Quality Measures, Advance Directive   Last Updated: 03-Apr-16 15:38 by Jobe Gibbon (MD)  References: 1.  Data Referenced From "[place] [facility] Nurse Note" 29-May-2014 9:54 AM

## 2014-07-03 ENCOUNTER — Inpatient Hospital Stay: Payer: Medicaid Other | Attending: Oncology

## 2014-07-03 DIAGNOSIS — C541 Malignant neoplasm of endometrium: Secondary | ICD-10-CM | POA: Diagnosis not present

## 2014-07-03 DIAGNOSIS — Z5111 Encounter for antineoplastic chemotherapy: Secondary | ICD-10-CM | POA: Diagnosis present

## 2014-07-03 DIAGNOSIS — D751 Secondary polycythemia: Secondary | ICD-10-CM | POA: Insufficient documentation

## 2014-07-03 DIAGNOSIS — R918 Other nonspecific abnormal finding of lung field: Secondary | ICD-10-CM | POA: Insufficient documentation

## 2014-07-03 DIAGNOSIS — F17210 Nicotine dependence, cigarettes, uncomplicated: Secondary | ICD-10-CM | POA: Insufficient documentation

## 2014-07-03 DIAGNOSIS — G629 Polyneuropathy, unspecified: Secondary | ICD-10-CM | POA: Insufficient documentation

## 2014-07-03 DIAGNOSIS — D72829 Elevated white blood cell count, unspecified: Secondary | ICD-10-CM | POA: Insufficient documentation

## 2014-07-03 DIAGNOSIS — E119 Type 2 diabetes mellitus without complications: Secondary | ICD-10-CM | POA: Insufficient documentation

## 2014-07-03 DIAGNOSIS — Z79899 Other long term (current) drug therapy: Secondary | ICD-10-CM | POA: Insufficient documentation

## 2014-07-03 DIAGNOSIS — C786 Secondary malignant neoplasm of retroperitoneum and peritoneum: Secondary | ICD-10-CM | POA: Insufficient documentation

## 2014-07-03 DIAGNOSIS — N1330 Unspecified hydronephrosis: Secondary | ICD-10-CM | POA: Insufficient documentation

## 2014-07-03 DIAGNOSIS — D473 Essential (hemorrhagic) thrombocythemia: Secondary | ICD-10-CM | POA: Insufficient documentation

## 2014-07-03 DIAGNOSIS — J449 Chronic obstructive pulmonary disease, unspecified: Secondary | ICD-10-CM | POA: Insufficient documentation

## 2014-07-03 LAB — CBC WITH DIFFERENTIAL/PLATELET
BASOS ABS: 0 10*3/uL (ref 0–0.1)
EOS ABS: 0.1 10*3/uL (ref 0–0.7)
HEMATOCRIT: 31 % — AB (ref 35.0–47.0)
HEMOGLOBIN: 10.6 g/dL — AB (ref 12.0–16.0)
Lymphocytes Relative: 29 %
Lymphs Abs: 1 10*3/uL (ref 1.0–3.6)
MCH: 32 pg (ref 26.0–34.0)
MCHC: 34.2 g/dL (ref 32.0–36.0)
MCV: 93.8 fL (ref 80.0–100.0)
Monocytes Absolute: 0.5 10*3/uL (ref 0.2–0.9)
Monocytes Relative: 14 %
Neutro Abs: 1.8 10*3/uL (ref 1.4–6.5)
Neutrophils Relative %: 53 %
PLATELETS: 164 10*3/uL (ref 150–440)
RBC: 3.3 MIL/uL — ABNORMAL LOW (ref 3.80–5.20)
RDW: 24 % — ABNORMAL HIGH (ref 11.5–14.5)
WBC: 3.4 10*3/uL — ABNORMAL LOW (ref 3.6–11.0)

## 2014-07-06 ENCOUNTER — Other Ambulatory Visit: Payer: Self-pay | Admitting: *Deleted

## 2014-07-06 DIAGNOSIS — C541 Malignant neoplasm of endometrium: Secondary | ICD-10-CM

## 2014-07-10 ENCOUNTER — Inpatient Hospital Stay: Payer: Medicaid Other

## 2014-07-10 ENCOUNTER — Encounter: Payer: Self-pay | Admitting: *Deleted

## 2014-07-10 ENCOUNTER — Other Ambulatory Visit: Payer: Self-pay | Admitting: Oncology

## 2014-07-10 ENCOUNTER — Inpatient Hospital Stay (HOSPITAL_BASED_OUTPATIENT_CLINIC_OR_DEPARTMENT_OTHER): Payer: Medicaid Other | Admitting: Oncology

## 2014-07-10 VITALS — BP 119/58 | HR 88 | Resp 20

## 2014-07-10 VITALS — BP 124/75 | HR 90 | Wt 214.9 lb

## 2014-07-10 DIAGNOSIS — G629 Polyneuropathy, unspecified: Secondary | ICD-10-CM

## 2014-07-10 DIAGNOSIS — N1330 Unspecified hydronephrosis: Secondary | ICD-10-CM

## 2014-07-10 DIAGNOSIS — D72829 Elevated white blood cell count, unspecified: Secondary | ICD-10-CM

## 2014-07-10 DIAGNOSIS — R918 Other nonspecific abnormal finding of lung field: Secondary | ICD-10-CM

## 2014-07-10 DIAGNOSIS — Z5111 Encounter for antineoplastic chemotherapy: Secondary | ICD-10-CM | POA: Diagnosis not present

## 2014-07-10 DIAGNOSIS — C55 Malignant neoplasm of uterus, part unspecified: Secondary | ICD-10-CM

## 2014-07-10 DIAGNOSIS — C541 Malignant neoplasm of endometrium: Secondary | ICD-10-CM | POA: Diagnosis not present

## 2014-07-10 DIAGNOSIS — C786 Secondary malignant neoplasm of retroperitoneum and peritoneum: Secondary | ICD-10-CM | POA: Diagnosis not present

## 2014-07-10 DIAGNOSIS — J449 Chronic obstructive pulmonary disease, unspecified: Secondary | ICD-10-CM

## 2014-07-10 DIAGNOSIS — F17210 Nicotine dependence, cigarettes, uncomplicated: Secondary | ICD-10-CM

## 2014-07-10 DIAGNOSIS — D473 Essential (hemorrhagic) thrombocythemia: Secondary | ICD-10-CM

## 2014-07-10 DIAGNOSIS — E119 Type 2 diabetes mellitus without complications: Secondary | ICD-10-CM

## 2014-07-10 DIAGNOSIS — C574 Malignant neoplasm of uterine adnexa, unspecified: Secondary | ICD-10-CM

## 2014-07-10 DIAGNOSIS — Z79899 Other long term (current) drug therapy: Secondary | ICD-10-CM

## 2014-07-10 DIAGNOSIS — D751 Secondary polycythemia: Secondary | ICD-10-CM

## 2014-07-10 LAB — COMPREHENSIVE METABOLIC PANEL
ALT: 13 U/L — ABNORMAL LOW (ref 14–54)
ANION GAP: 5 (ref 5–15)
AST: 14 U/L — ABNORMAL LOW (ref 15–41)
Albumin: 3.4 g/dL — ABNORMAL LOW (ref 3.5–5.0)
Alkaline Phosphatase: 89 U/L (ref 38–126)
BILIRUBIN TOTAL: 0.3 mg/dL (ref 0.3–1.2)
BUN: 22 mg/dL — AB (ref 6–20)
CALCIUM: 8.9 mg/dL (ref 8.9–10.3)
CO2: 29 mmol/L (ref 22–32)
Chloride: 102 mmol/L (ref 101–111)
Creatinine, Ser: 0.61 mg/dL (ref 0.44–1.00)
GFR calc non Af Amer: >60 mL/min (ref >60)
Glucose, Bld: 123 mg/dL — ABNORMAL HIGH (ref 65–99)
Potassium: 3.7 mmol/L (ref 3.5–5.1)
Sodium: 136 mmol/L (ref 135–145)
Total Protein: 7.3 g/dL (ref 6.5–8.1)

## 2014-07-10 LAB — CBC WITH DIFFERENTIAL/PLATELET
BASOS ABS: 0.1 10*3/uL (ref 0–0.1)
BASOS PCT: 1 %
EOS ABS: 0.1 10*3/uL (ref 0–0.7)
Eosinophils Relative: 1 %
HEMATOCRIT: 31.4 % — AB (ref 35.0–47.0)
Hemoglobin: 10.6 g/dL — ABNORMAL LOW (ref 12.0–16.0)
LYMPHS PCT: 19 %
Lymphs Abs: 1.3 10*3/uL (ref 1.0–3.6)
MCH: 32.1 pg (ref 26.0–34.0)
MCHC: 33.8 g/dL (ref 32.0–36.0)
MCV: 95.2 fL (ref 80.0–100.0)
Monocytes Absolute: 0.7 10*3/uL (ref 0.2–0.9)
Monocytes Relative: 11 %
Neutro Abs: 4.5 10*3/uL (ref 1.4–6.5)
Neutrophils Relative %: 68 %
PLATELETS: 183 10*3/uL (ref 150–440)
RBC: 3.3 MIL/uL — ABNORMAL LOW (ref 3.80–5.20)
RDW: 24.9 % — ABNORMAL HIGH (ref 11.5–14.5)
WBC: 6.6 10*3/uL (ref 3.6–11.0)

## 2014-07-10 LAB — MAGNESIUM: Magnesium: 1.5 mg/dL — ABNORMAL LOW (ref 1.7–2.4)

## 2014-07-10 MED ORDER — MAGNESIUM SULFATE 2 GM/50ML IV SOLN
2.0000 g | Freq: Once | INTRAVENOUS | Status: AC
  Administered 2014-07-10: 2 g via INTRAVENOUS
  Filled 2014-07-10: qty 50

## 2014-07-10 MED ORDER — HEPARIN SOD (PORK) LOCK FLUSH 100 UNIT/ML IV SOLN
500.0000 [IU] | Freq: Once | INTRAVENOUS | Status: AC
  Administered 2014-07-10: 500 [IU] via INTRAVENOUS
  Filled 2014-07-10: qty 5

## 2014-07-10 MED ORDER — SODIUM CHLORIDE 0.9 % IV SOLN
200.0000 mg/m2 | Freq: Once | INTRAVENOUS | Status: DC
  Filled 2014-07-10: qty 68

## 2014-07-10 MED ORDER — DIPHENHYDRAMINE HCL 50 MG/ML IJ SOLN
50.0000 mg | Freq: Once | INTRAMUSCULAR | Status: AC
  Administered 2014-07-10: 50 mg via INTRAVENOUS
  Filled 2014-07-10: qty 1

## 2014-07-10 MED ORDER — SODIUM CHLORIDE 0.9 % IV SOLN
676.8000 mg | Freq: Once | INTRAVENOUS | Status: AC
  Administered 2014-07-10: 680 mg via INTRAVENOUS
  Filled 2014-07-10: qty 68

## 2014-07-10 MED ORDER — SODIUM CHLORIDE 0.9 % IV SOLN: 2.0000 g | Freq: Once | INTRAVENOUS | Status: DC

## 2014-07-10 MED ORDER — HYDROCODONE-ACETAMINOPHEN 10-325 MG PO TABS: 1.0000 | ORAL_TABLET | Freq: Four times a day (QID) | ORAL | Status: DC | PRN

## 2014-07-10 MED ORDER — SODIUM CHLORIDE 0.9 % IV SOLN
Freq: Once | INTRAVENOUS | Status: AC
  Administered 2014-07-10: 12:00:00 via INTRAVENOUS
  Filled 2014-07-10: qty 250

## 2014-07-10 MED ORDER — SODIUM CHLORIDE 0.9 % IV SOLN
200.0000 mg/m2 | Freq: Once | INTRAVENOUS | Status: AC
  Administered 2014-07-10: 408 mg via INTRAVENOUS
  Filled 2014-07-10: qty 68

## 2014-07-10 MED ORDER — SODIUM CHLORIDE 0.9 % IV SOLN
Freq: Once | INTRAVENOUS | Status: AC
  Administered 2014-07-10: 13:00:00 via INTRAVENOUS
  Filled 2014-07-10: qty 5

## 2014-07-10 MED ORDER — SODIUM CHLORIDE 0.9 % IJ SOLN
10.0000 mL | INTRAMUSCULAR | Status: DC | PRN
  Administered 2014-07-10: 10 mL via INTRAVENOUS
  Filled 2014-07-10: qty 10

## 2014-07-10 MED ORDER — LORAZEPAM 2 MG/ML IJ SOLN
1.0000 mg | Freq: Once | INTRAMUSCULAR | Status: AC
  Administered 2014-07-10: 1 mg via INTRAVENOUS
  Filled 2014-07-10: qty 1

## 2014-07-10 MED ORDER — PALONOSETRON HCL INJECTION 0.25 MG/5ML
0.2500 mg | Freq: Once | INTRAVENOUS | Status: AC
  Administered 2014-07-10: 0.25 mg via INTRAVENOUS
  Filled 2014-07-10: qty 5

## 2014-07-10 MED ORDER — FAMOTIDINE IN NACL 20-0.9 MG/50ML-% IV SOLN
20.0000 mg | Freq: Once | INTRAVENOUS | Status: AC
  Administered 2014-07-10: 20 mg via INTRAVENOUS
  Filled 2014-07-10: qty 50

## 2014-07-10 MED ORDER — KETOROLAC TROMETHAMINE 15 MG/ML IJ SOLN
15.0000 mg | Freq: Once | INTRAMUSCULAR | Status: AC
  Administered 2014-07-10: 15 mg via INTRAVENOUS
  Filled 2014-07-10: qty 1

## 2014-07-10 MED ORDER — SODIUM CHLORIDE 0.9 % IV SOLN: Freq: Once | INTRAVENOUS | Status: DC

## 2014-07-10 MED ORDER — CYCLOBENZAPRINE HCL 5 MG PO TABS: 5.0000 mg | ORAL_TABLET | Freq: Three times a day (TID) | ORAL | Status: DC | PRN

## 2014-07-10 MED ORDER — PACLITAXEL CHEMO INJECTION 300 MG/50ML
200.0000 mg/m2 | Freq: Once | INTRAVENOUS | Status: DC
  Filled 2014-07-10: qty 68

## 2014-07-11 LAB — CA 125: CA 125: 14.4 U/mL (ref 0.0–34.0)

## 2014-07-17 ENCOUNTER — Inpatient Hospital Stay: Payer: Medicaid Other | Attending: Oncology

## 2014-07-17 DIAGNOSIS — Z79899 Other long term (current) drug therapy: Secondary | ICD-10-CM | POA: Diagnosis not present

## 2014-07-17 DIAGNOSIS — F17210 Nicotine dependence, cigarettes, uncomplicated: Secondary | ICD-10-CM | POA: Diagnosis not present

## 2014-07-17 DIAGNOSIS — D473 Essential (hemorrhagic) thrombocythemia: Secondary | ICD-10-CM | POA: Insufficient documentation

## 2014-07-17 DIAGNOSIS — G629 Polyneuropathy, unspecified: Secondary | ICD-10-CM | POA: Insufficient documentation

## 2014-07-17 DIAGNOSIS — M542 Cervicalgia: Secondary | ICD-10-CM | POA: Insufficient documentation

## 2014-07-17 DIAGNOSIS — Z794 Long term (current) use of insulin: Secondary | ICD-10-CM | POA: Insufficient documentation

## 2014-07-17 DIAGNOSIS — R252 Cramp and spasm: Secondary | ICD-10-CM | POA: Insufficient documentation

## 2014-07-17 DIAGNOSIS — D751 Secondary polycythemia: Secondary | ICD-10-CM | POA: Diagnosis not present

## 2014-07-17 DIAGNOSIS — Z5111 Encounter for antineoplastic chemotherapy: Secondary | ICD-10-CM | POA: Insufficient documentation

## 2014-07-17 DIAGNOSIS — C541 Malignant neoplasm of endometrium: Secondary | ICD-10-CM | POA: Insufficient documentation

## 2014-07-17 DIAGNOSIS — F419 Anxiety disorder, unspecified: Secondary | ICD-10-CM | POA: Insufficient documentation

## 2014-07-17 DIAGNOSIS — D72829 Elevated white blood cell count, unspecified: Secondary | ICD-10-CM | POA: Diagnosis not present

## 2014-07-17 DIAGNOSIS — F329 Major depressive disorder, single episode, unspecified: Secondary | ICD-10-CM | POA: Insufficient documentation

## 2014-07-17 DIAGNOSIS — M129 Arthropathy, unspecified: Secondary | ICD-10-CM | POA: Diagnosis not present

## 2014-07-17 DIAGNOSIS — C55 Malignant neoplasm of uterus, part unspecified: Secondary | ICD-10-CM

## 2014-07-17 DIAGNOSIS — R251 Tremor, unspecified: Secondary | ICD-10-CM | POA: Insufficient documentation

## 2014-07-17 DIAGNOSIS — J449 Chronic obstructive pulmonary disease, unspecified: Secondary | ICD-10-CM | POA: Diagnosis not present

## 2014-07-17 DIAGNOSIS — E119 Type 2 diabetes mellitus without complications: Secondary | ICD-10-CM | POA: Diagnosis not present

## 2014-07-17 LAB — CBC WITH DIFFERENTIAL/PLATELET
BASOS ABS: 0 10*3/uL (ref 0–0.1)
Basophils Relative: 1 %
Eosinophils Absolute: 0.1 10*3/uL (ref 0–0.7)
Eosinophils Relative: 3 %
HCT: 33.9 % — ABNORMAL LOW (ref 35.0–47.0)
HEMOGLOBIN: 11.4 g/dL — AB (ref 12.0–16.0)
LYMPHS PCT: 29 %
Lymphs Abs: 1.1 10*3/uL (ref 1.0–3.6)
MCH: 32.9 pg (ref 26.0–34.0)
MCHC: 33.6 g/dL (ref 32.0–36.0)
MCV: 97.8 fL (ref 80.0–100.0)
Monocytes Absolute: 0.3 10*3/uL (ref 0.2–0.9)
Monocytes Relative: 7 %
NEUTROS ABS: 2.3 10*3/uL (ref 1.4–6.5)
Neutrophils Relative %: 60 %
Platelets: 118 10*3/uL — ABNORMAL LOW (ref 150–440)
RBC: 3.47 MIL/uL — ABNORMAL LOW (ref 3.80–5.20)
RDW: 22.6 % — AB (ref 11.5–14.5)
WBC: 3.9 10*3/uL (ref 3.6–11.0)

## 2014-07-23 NOTE — Progress Notes (Signed)
[place] @ [facility] Telephone:([phone]) [phone]  [phone]     MESA JANUS OB: 23-Aug-1960  MR#: [id number]  AES#:[id number]  Patient Care Team: Sharyne Peach, MD as PCP - General (Family Medicine)  CHIEF COMPLAINT:  Chief Complaint  Patient presents with  . Follow-up    Oncology History   Chief Complaint/Diagnosis:   1. Endometroid adenocarcinoma, stage IIIC, grade 3, TAHBSO and complete staging. 2. Carboplatin and paclitaxel x 3.  XRT, treatments delayed due to multiple factors. 3. CT 03/20/14 Findings are compatible with widespread metastatic disease.Specifically, there appears to be local recurrence of disease in the low anatomic pelvis with and ill-defined amorphous soft tissue mass extending from the right side of the vaginal apex to the pelvic sidewall, causing some mild obstruction at the junction of distal and middle third of the right ureter (with mild proximal right hydroureteronephrosis), widespread omental and intraperitoneal metastases, retroperitoneal lymphadenopathy and new pulmonary nodules, as detailed above. 4. February 2016, Biopsy of abdominal mass is positive for malignancy consistent with endometrial cancer. 5. Started Carboplatin AUC 5 and Taxol 128m/m2, 04/17/2014.     Cancer of uterine adnexa   07/10/2014 Initial Diagnosis Cancer of uterine adnexa    Cancer of uterus   03/09/2014 Initial Diagnosis Cancer of uterus    Oncology Flowsheet 07/10/2014  Day, Cycle Day 1, 4  CARBOplatin (PARAPLATIN) IV 680 mg  dexamethasone (DECADRON) IV [ 12 mg ]  fosaprepitant (EMEND) IV [ 150 mg ]  LORazepam (ATIVAN) IV 1 mg  PACLitaxel (TAXOL) IV 200 mg/m2  palonosetron (ALOXI) IV 0.25 mg    INTERVAL HISTORY: 54year old lady with stage IV endometrial cancer.  Here for further continuation of chemotherapy.  This is cycle 4 treatment.  Continues to have some aches and pains.  Neuropathy.  By tumor marker criteria patient is responding to the treatment.  No nausea.   No vomiting.  REVIEW OF SYSTEMS:   Gen. status: Patient is alert oriented somewhat depressed. HEENT: No soreness in the mouth no difficulty swallowing Lungs: No cough or shortness of breath Cardiac: No chest pain.  No paroxysmal nocturnal dyspnea GI: No nausea no vomiting no diarrhea no abdominal pain GU: No dysuria hematuria Skin: No rash Lower extremity no swelling  As per HPI. Otherwise, a complete review of systems is negatve.  PAST MEDICAL HISTORY:  PAST SURGICAL HISTORY:  FAMILY HISTORY   Additional Past Medical and Surgical History: Past Medical History/Past Surgical History -  Diabetes mellitus  Chronic obstructive pulmonary disease  Allergic rhinitis  Depression  Chronic smoker  Chronic leukocytosis, thrombocytosis, erythrocytosis  Proteinuria, on Accupril started in 2003  Venous stasis  Peripheral neuropathy  Stomach ulcer 1980 with GI bleed  Arthritis right shoulder  Cholecystectomy  Anxiety/depression  Cataracts    Family History - remarkable for diabetes, hyperlipidemia, hypertension, heart disease, bladder cancer and leukemia.    Social History - chronic smoker 45-pack-years, ongoing.  Denies alcohol or recreational drug usage.  Physically active and ambulatory.   Home Medications:    ADVANCED DIRECTIVES:    HEALTH MAINTENANCE: History  Substance Use Topics  . Smoking status: Not on file  . Smokeless tobacco: Not on file  . Alcohol Use: Not on file     Allergies  Allergen Reactions  . Aspirin     Other reaction(s): Unknown  . Codeine     Other reaction(s): Unknown    Current Outpatient Prescriptions  Medication Sig Dispense Refill  . furosemide (LASIX) 20 MG tablet take 2 tablets  by mouth once daily    . simvastatin (ZOCOR) 20 MG tablet Take by mouth.    . ADVAIR DISKUS 250-50 MCG/DOSE AEPB   1  . B-D ULTRAFINE III SHORT PEN 31G X 8 MM MISC   0  . cyclobenzaprine (FLEXERIL) 5 MG tablet Take 1 tablet (5 mg total) by mouth 3 (three)  times daily as needed for muscle spasms. 60 tablet 3  . furosemide (LASIX) 20 MG tablet   0  . gabapentin (NEURONTIN) 300 MG capsule   1  . glipiZIDE (GLUCOTROL XL) 5 MG 24 hr tablet   1  . HYDROcodone-acetaminophen (NORCO) 10-325 MG per tablet Take 1 tablet by mouth every 6 (six) hours as needed. 30 tablet 0  . LANTUS SOLOSTAR 100 UNIT/ML Solostar Pen   1  . lisinopril (PRINIVIL,ZESTRIL) 10 MG tablet   1  . metFORMIN (GLUCOPHAGE) 1000 MG tablet   0  . montelukast (SINGULAIR) 10 MG tablet Take 10 mg by mouth every evening.  0  . omeprazole (PRILOSEC) 20 MG capsule   0  . ondansetron (ZOFRAN) 4 MG tablet   0  . PROAIR HFA 108 (90 BASE) MCG/ACT inhaler   1  . traZODone (DESYREL) 50 MG tablet   0   No current facility-administered medications for this visit.    OBJECTIVE:  Filed Vitals:   07/10/14 1049  BP: 124/75  Pulse: 90     Body mass index is 37.15 kg/(m^2).    ECOG FS:1 - Symptomatic but completely ambulatory  PHYSICAL EXAM: General status: Performance status is good.  Patient has not lost significant weight HEENT: No evidence of stomatitis.  And alopecia Sclera and conjunctivae :: No jaundice.   pale looking . Lungs: Air  entry equal on both sides.  No rhonchi.  No rales.  Cardiac: Heart sounds are normal.  No pericardial rub.  No murmur. Lymphatic system: Cervical, axillary, inguinal, lymph nodes not palpable GI: Abdomen is soft.  No ascites.  Liver spleen not palpable.  No tenderness.  Bowel sounds are within normal limit Lower extremity: No edema Neurological system: Higher functions, cranial nerves intact.  Peripheral neuropathy No evidence of peripheral neuropathy. Skin: No rash.  No ecchymosis..  Target lesion: LAB RESULTS:  Infusion on 07/10/2014  Component Date Value Ref Range Status  . WBC 07/10/2014 6.6  3.6 - 11.0 K/uL Final  . RBC 07/10/2014 3.30* 3.80 - 5.20 MIL/uL Final  . Hemoglobin 07/10/2014 10.6* 12.0 - 16.0 g/dL Final  . HCT 07/10/2014 31.4* 35.0  - 47.0 % Final  . MCV 07/10/2014 95.2  80.0 - 100.0 fL Final  . MCH 07/10/2014 32.1  26.0 - 34.0 pg Final  . MCHC 07/10/2014 33.8  32.0 - 36.0 g/dL Final  . RDW 07/10/2014 24.9* 11.5 - 14.5 % Final  . Platelets 07/10/2014 183  150 - 440 K/uL Final  . Neutrophils Relative % 07/10/2014 68   Final  . Neutro Abs 07/10/2014 4.5  1.4 - 6.5 K/uL Final  . Lymphocytes Relative 07/10/2014 19   Final  . Lymphs Abs 07/10/2014 1.3  1.0 - 3.6 K/uL Final  . Monocytes Relative 07/10/2014 11   Final  . Monocytes Absolute 07/10/2014 0.7  0.2 - 0.9 K/uL Final  . Eosinophils Relative 07/10/2014 1   Final  . Eosinophils Absolute 07/10/2014 0.1  0 - 0.7 K/uL Final  . Basophils Relative 07/10/2014 1   Final  . Basophils Absolute 07/10/2014 0.1  0 - 0.1 K/uL Final  . Sodium  07/10/2014 136  135 - 145 mmol/L Final  . Potassium 07/10/2014 3.7  3.5 - 5.1 mmol/L Final  . Chloride 07/10/2014 102  101 - 111 mmol/L Final  . CO2 07/10/2014 29  22 - 32 mmol/L Final  . Glucose, Bld 07/10/2014 123* 65 - 99 mg/dL Final  . BUN 07/10/2014 22* 6 - 20 mg/dL Final  . Creatinine, Ser 07/10/2014 0.61  0.44 - 1.00 mg/dL Final  . Calcium 07/10/2014 8.9  8.9 - 10.3 mg/dL Final  . Total Protein 07/10/2014 7.3  6.5 - 8.1 g/dL Final  . Albumin 07/10/2014 3.4* 3.5 - 5.0 g/dL Final  . AST 07/10/2014 14* 15 - 41 U/L Final  . ALT 07/10/2014 13* 14 - 54 U/L Final  . Alkaline Phosphatase 07/10/2014 89  38 - 126 U/L Final  . Total Bilirubin 07/10/2014 0.3  0.3 - 1.2 mg/dL Final  . GFR calc non Af Amer 07/10/2014 >60  >60 mL/min Final  . GFR calc Af Amer 07/10/2014 >60  >60 mL/min Final   Comment: (NOTE) The eGFR has been calculated using the CKD EPI equation. This calculation has not been validated in all clinical situations. eGFR's persistently <60 mL/min signify possible Chronic Kidney Disease.   . Anion gap 07/10/2014 5  5 - 15 Final  . Magnesium 07/10/2014 1.5* 1.7 - 2.4 mg/dL Final  . CA 125 07/10/2014 14.4  0.0 - 34.0 U/mL  Final   Comment: (NOTE) Roche ECLIA methodology **Effective Jul 23, 2014, CA-125 reference interval will be changing**   to: 0.0 - 38.1 U/mL Performed At: [facility] [place], [place] [id number] Lindon Romp MD [id number]     Lab Results  Component Value Date   CA125 14.4 07/10/2014     ASSESSMENT: Stage IV endometrial cancer  MEDICAL DECISION MAKING:  1.  All lab data has been reviewed. By tumor marker criteria as patient is responding to the chemotherapy will continue 2 more cycles of chemotherapy with carboplatinum and Taxol. We will get estrogen receptor study done on the patient's tissue Peripheral neuropathy is stable Patient continues to have multiple psychiatric issues and depression which is been reviewed During infusion Patient had  some muscle pain , and IV Toradol was given to relieve pain  Patient expressed understanding and was in agreement with this plan. She also understands that She can call clinic at any time with any questions, concerns, or complaints.    Cancer of uterus   Staging form: Corpus Uteri - Carcinoma, AJCC 7th Edition     Clinical: Stage IVB (T3b, N0, M1) - Unsigned   Forest Gleason, MD   07/23/2014 8:03 AM

## 2014-07-24 ENCOUNTER — Inpatient Hospital Stay: Payer: Medicaid Other

## 2014-07-24 ENCOUNTER — Inpatient Hospital Stay (HOSPITAL_BASED_OUTPATIENT_CLINIC_OR_DEPARTMENT_OTHER): Payer: Medicaid Other | Admitting: Oncology

## 2014-07-24 ENCOUNTER — Telehealth: Payer: Self-pay | Admitting: *Deleted

## 2014-07-24 VITALS — BP 118/71 | HR 102 | Temp 97.2°F | Resp 20

## 2014-07-24 DIAGNOSIS — R252 Cramp and spasm: Secondary | ICD-10-CM

## 2014-07-24 DIAGNOSIS — D473 Essential (hemorrhagic) thrombocythemia: Secondary | ICD-10-CM

## 2014-07-24 DIAGNOSIS — D72829 Elevated white blood cell count, unspecified: Secondary | ICD-10-CM

## 2014-07-24 DIAGNOSIS — C541 Malignant neoplasm of endometrium: Secondary | ICD-10-CM | POA: Diagnosis not present

## 2014-07-24 DIAGNOSIS — G629 Polyneuropathy, unspecified: Secondary | ICD-10-CM

## 2014-07-24 DIAGNOSIS — M129 Arthropathy, unspecified: Secondary | ICD-10-CM

## 2014-07-24 DIAGNOSIS — D751 Secondary polycythemia: Secondary | ICD-10-CM

## 2014-07-24 DIAGNOSIS — J449 Chronic obstructive pulmonary disease, unspecified: Secondary | ICD-10-CM

## 2014-07-24 DIAGNOSIS — E119 Type 2 diabetes mellitus without complications: Secondary | ICD-10-CM

## 2014-07-24 DIAGNOSIS — Z5111 Encounter for antineoplastic chemotherapy: Secondary | ICD-10-CM | POA: Diagnosis not present

## 2014-07-24 DIAGNOSIS — Z794 Long term (current) use of insulin: Secondary | ICD-10-CM

## 2014-07-24 DIAGNOSIS — F329 Major depressive disorder, single episode, unspecified: Secondary | ICD-10-CM

## 2014-07-24 DIAGNOSIS — C55 Malignant neoplasm of uterus, part unspecified: Secondary | ICD-10-CM

## 2014-07-24 DIAGNOSIS — R251 Tremor, unspecified: Secondary | ICD-10-CM | POA: Diagnosis not present

## 2014-07-24 DIAGNOSIS — F17210 Nicotine dependence, cigarettes, uncomplicated: Secondary | ICD-10-CM

## 2014-07-24 DIAGNOSIS — M542 Cervicalgia: Secondary | ICD-10-CM

## 2014-07-24 DIAGNOSIS — Z79899 Other long term (current) drug therapy: Secondary | ICD-10-CM

## 2014-07-24 DIAGNOSIS — F419 Anxiety disorder, unspecified: Secondary | ICD-10-CM

## 2014-07-24 LAB — CBC WITH DIFFERENTIAL/PLATELET
BASOS PCT: 1 %
Basophils Absolute: 0 10*3/uL (ref 0–0.1)
Eosinophils Absolute: 0.1 10*3/uL (ref 0–0.7)
Eosinophils Relative: 2 %
HCT: 31.3 % — ABNORMAL LOW (ref 35.0–47.0)
Hemoglobin: 10.8 g/dL — ABNORMAL LOW (ref 12.0–16.0)
LYMPHS ABS: 1.1 10*3/uL (ref 1.0–3.6)
LYMPHS PCT: 34 %
MCH: 34 pg (ref 26.0–34.0)
MCHC: 34.3 g/dL (ref 32.0–36.0)
MCV: 99.1 fL (ref 80.0–100.0)
MONO ABS: 0.5 10*3/uL (ref 0.2–0.9)
MONOS PCT: 17 %
NEUTROS ABS: 1.5 10*3/uL (ref 1.4–6.5)
Neutrophils Relative %: 46 %
Platelets: 129 10*3/uL — ABNORMAL LOW (ref 150–440)
RBC: 3.16 MIL/uL — AB (ref 3.80–5.20)
RDW: 21.8 % — AB (ref 11.5–14.5)
WBC: 3.2 10*3/uL — ABNORMAL LOW (ref 3.6–11.0)

## 2014-07-24 LAB — COMPREHENSIVE METABOLIC PANEL
ALT: 16 U/L (ref 14–54)
AST: 21 U/L (ref 15–41)
Albumin: 3.6 g/dL (ref 3.5–5.0)
Alkaline Phosphatase: 94 U/L (ref 38–126)
Anion gap: 7 (ref 5–15)
BUN: 11 mg/dL (ref 6–20)
CHLORIDE: 101 mmol/L (ref 101–111)
CO2: 26 mmol/L (ref 22–32)
Calcium: 8.9 mg/dL (ref 8.9–10.3)
Creatinine, Ser: 0.61 mg/dL (ref 0.44–1.00)
GFR calc Af Amer: >60 mL/min (ref >60)
GFR calc non Af Amer: >60 mL/min (ref >60)
GLUCOSE: 160 mg/dL — AB (ref 65–99)
Potassium: 4.1 mmol/L (ref 3.5–5.1)
SODIUM: 134 mmol/L — AB (ref 135–145)
TOTAL PROTEIN: 7.6 g/dL (ref 6.5–8.1)
Total Bilirubin: 0.5 mg/dL (ref 0.3–1.2)

## 2014-07-24 LAB — MAGNESIUM: Magnesium: 1.3 mg/dL — ABNORMAL LOW (ref 1.7–2.4)

## 2014-07-24 MED ORDER — LORAZEPAM 2 MG/ML IJ SOLN
1.0000 mg | Freq: Once | INTRAMUSCULAR | Status: AC
  Administered 2014-07-24: 1 mg via INTRAVENOUS
  Filled 2014-07-24: qty 1

## 2014-07-24 MED ORDER — HYDROCODONE-ACETAMINOPHEN 10-325 MG PO TABS: 1.0000 | ORAL_TABLET | Freq: Four times a day (QID) | ORAL | Status: DC | PRN

## 2014-07-24 MED ORDER — MAGNESIUM SULFATE 4 GM/100ML IV SOLN
4.0000 g | Freq: Once | INTRAVENOUS | Status: AC
  Administered 2014-07-24: 4 g via INTRAVENOUS
  Filled 2014-07-24: qty 100

## 2014-07-24 MED ORDER — SODIUM CHLORIDE 0.9 % IJ SOLN
10.0000 mL | INTRAMUSCULAR | Status: DC | PRN
  Administered 2014-07-24: 10 mL via INTRAVENOUS
  Filled 2014-07-24: qty 10

## 2014-07-24 MED ORDER — SODIUM CHLORIDE 0.9 % IV SOLN
INTRAVENOUS | Status: DC
  Administered 2014-07-24: 11:00:00 via INTRAVENOUS
  Filled 2014-07-24: qty 250

## 2014-07-24 MED ORDER — KETOROLAC TROMETHAMINE 15 MG/ML IJ SOLN
15.0000 mg | Freq: Once | INTRAMUSCULAR | Status: AC
  Administered 2014-07-24: 15 mg via INTRAVENOUS
  Filled 2014-07-24: qty 1

## 2014-07-24 MED ORDER — HEPARIN SOD (PORK) LOCK FLUSH 100 UNIT/ML IV SOLN
500.0000 [IU] | Freq: Once | INTRAVENOUS | Status: AC
  Administered 2014-07-24: 500 [IU] via INTRAVENOUS
  Filled 2014-07-24: qty 5

## 2014-07-24 NOTE — Telephone Encounter (Signed)
Informed that prescription is ready to pick up  

## 2014-07-29 ENCOUNTER — Encounter: Payer: Self-pay | Admitting: Oncology

## 2014-07-29 NOTE — Progress Notes (Signed)
[place] @ [facility] [phone]  Fax:(336) [phone]     Zoe [name] OB: 1960-09-19  MR#: [id number]  EZM#:[id number]  Patient Care Team: Sharyne Peach, MD as PCP - General (Family Medicine)  CHIEF COMPLAINT:  Chief Complaint  Patient presents with  . Acute Visit    patient having severe pain in the back of the neck with tremors    Oncology History   Chief Complaint/Diagnosis:   1. Endometroid adenocarcinoma, stage IIIC, grade 3, TAHBSO and complete staging. 2. Carboplatin and paclitaxel x 3.  XRT, treatments delayed due to multiple factors. 3. CT 03/20/14 Findings are compatible with widespread metastatic disease.Specifically, there appears to be local recurrence of disease in the low anatomic pelvis with and ill-defined amorphous soft tissue mass extending from the right side of the vaginal apex to the pelvic sidewall, causing some mild obstruction at the junction of distal and middle third of the right ureter (with mild proximal right hydroureteronephrosis), widespread omental and intraperitoneal metastases, retroperitoneal lymphadenopathy and new pulmonary nodules, as detailed above. 4. February 2016, Biopsy of abdominal mass is positive for malignancy consistent with endometrial cancer. 5. Started Carboplatin AUC 5 and Taxol 145m/m2, 04/17/2014.     Cancer of uterine adnexa   07/10/2014 Initial Diagnosis Cancer of uterine adnexa    Cancer of uterus   03/09/2014 Initial Diagnosis Cancer of uterus    Oncology Flowsheet 07/10/2014 07/24/2014  Day, Cycle Day 1, 4 -  CARBOplatin (PARAPLATIN) IV 680 mg -  dexamethasone (DECADRON) IV [ 12 mg ] -  fosaprepitant (EMEND) IV [ 150 mg ] -  LORazepam (ATIVAN) IV 1 mg 1 mg  PACLitaxel (TAXOL) IV 200 mg/m2 -  palonosetron (ALOXI) IV 0.25 mg -    INTERVAL HISTORY:54year old lady came as an acute ADD  on complaining of severe muscle spasm.  Crying.  Tremulous. According to patient's mother who brought her most of the  problem started this morning.  Patient is having spasm in hands.  Which is painful.  REVIEW OF SYSTEMS:   Gen. status: Patient is alert oriented somewhat depressed. HEENT: No soreness in the mouth no difficulty swallowing Lungs: No cough or shortness of breath Cardiac: No chest pain.  No paroxysmal nocturnal dyspnea GI: No nausea no vomiting no diarrhea no abdominal pain GU: No dysuria hematuria Skin: No rash Lower extremity no swelling Patient is extremely most no crying.  Having spasms in upper extremity.  Very tremulous. Most of the history was provided by mother  As per HPI. Otherwise, a complete review of systems is negatve.     Additional Past Medical and Surgical History: Past Medical History/Past Surgical History -  Diabetes mellitus  Chronic obstructive pulmonary disease  Allergic rhinitis  Depression  Chronic smoker  Chronic leukocytosis, thrombocytosis, erythrocytosis  Proteinuria, on Accupril started in 2003  Venous stasis  Peripheral neuropathy  Stomach ulcer 1980 with GI bleed  Arthritis right shoulder  Cholecystectomy  Anxiety/depression  Cataracts    Family History - remarkable for diabetes, hyperlipidemia, hypertension, heart disease, bladder cancer and leukemia.    Social History - chronic smoker 45-pack-years, ongoing.  Denies alcohol or recreational drug usage.  Physically active and ambulatory.   Home Medications:    ADVANCED DIRECTIVES: Patient does not have any advanced healthcare directive. Information has been given.   HEALTH MAINTENANCE: History  Substance Use Topics  . Smoking status: Current Every Day Smoker  . Smokeless tobacco: Not on file  . Alcohol Use: Not on file  Allergies  Allergen Reactions  . Aspirin     Other reaction(s): Unknown  . Codeine     Other reaction(s): Unknown    Current Outpatient Prescriptions  Medication Sig Dispense Refill  . ADVAIR DISKUS 250-50 MCG/DOSE AEPB   1  . B-D ULTRAFINE III SHORT PEN  31G X 8 MM MISC   0  . cyclobenzaprine (FLEXERIL) 5 MG tablet Take 1 tablet (5 mg total) by mouth 3 (three) times daily as needed for muscle spasms. 60 tablet 3  . furosemide (LASIX) 20 MG tablet take 2 tablets by mouth once daily    . gabapentin (NEURONTIN) 300 MG capsule   1  . glipiZIDE (GLUCOTROL XL) 5 MG 24 hr tablet   1  . HYDROcodone-acetaminophen (NORCO) 10-325 MG per tablet Take 1 tablet by mouth every 6 (six) hours as needed. 60 tablet 0  . LANTUS SOLOSTAR 100 UNIT/ML Solostar Pen   1  . lisinopril (PRINIVIL,ZESTRIL) 10 MG tablet   1  . metFORMIN (GLUCOPHAGE) 1000 MG tablet   0  . montelukast (SINGULAIR) 10 MG tablet Take 10 mg by mouth every evening.  0  . omeprazole (PRILOSEC) 20 MG capsule   0  . ondansetron (ZOFRAN) 4 MG tablet   0  . PROAIR HFA 108 (90 BASE) MCG/ACT inhaler   1  . simvastatin (ZOCOR) 20 MG tablet Take by mouth.    . traZODone (DESYREL) 50 MG tablet   0   No current facility-administered medications for this visit.    OBJECTIVE:  Filed Vitals:     There is no weight on file to calculate BMI.    ECOG FS:1 - Symptomatic but completely ambulatory  PHYSICAL EXAM: General status: Performance status is good.  Patient has not lost significant weight HEENT: No evidence of stomatitis.  And alopecia Sclera and conjunctivae :: No jaundice.   pale looking . Lungs: Air  entry equal on both sides.  No rhonchi.  No rales.  Cardiac: Heart sounds are normal.  No pericardial rub.  No murmur. Lymphatic system: Cervical, axillary, inguinal, lymph nodes not palpable GI: Abdomen is soft.  No ascites.  Liver spleen not palpable.  No tenderness.  Bowel sounds are within normal limit Lower extremity: No edema Neurological system: Higher functions, cranial nerves intact.  Peripheral neuropathy Patient had spasm in the upper extremity.  Patient was very tremulous. No evidence of peripheral neuropathy. Skin: No rash.  No ecchymosis.[facility] :: Crying , very  depressed  Target lesion: LAB RESULTS:  Infusion on 07/24/2014  Component Date Value Ref Range Status  . WBC 07/24/2014 3.2* 3.6 - 11.0 K/uL Final  . RBC 07/24/2014 3.16* 3.80 - 5.20 MIL/uL Final  . Hemoglobin 07/24/2014 10.8* 12.0 - 16.0 g/dL Final  . HCT 07/24/2014 31.3* 35.0 - 47.0 % Final  . MCV 07/24/2014 99.1  80.0 - 100.0 fL Final  . MCH 07/24/2014 34.0  26.0 - 34.0 pg Final  . MCHC 07/24/2014 34.3  32.0 - 36.0 g/dL Final  . RDW 07/24/2014 21.8* 11.5 - 14.5 % Final  . Platelets 07/24/2014 129* 150 - 440 K/uL Final  . Neutrophils Relative % 07/24/2014 46   Final  . Neutro Abs 07/24/2014 1.5  1.4 - 6.5 K/uL Final  . Lymphocytes Relative 07/24/2014 34   Final  . Lymphs Abs 07/24/2014 1.1  1.0 - 3.6 K/uL Final  . Monocytes Relative 07/24/2014 17   Final  . Monocytes Absolute 07/24/2014 0.5  0.2 - 0.9 K/uL Final  .  Eosinophils Relative 07/24/2014 2   Final  . Eosinophils Absolute 07/24/2014 0.1  0 - 0.7 K/uL Final  . Basophils Relative 07/24/2014 1   Final  . Basophils Absolute 07/24/2014 0.0  0 - 0.1 K/uL Final  . Sodium 07/24/2014 134* 135 - 145 mmol/L Final  . Potassium 07/24/2014 4.1  3.5 - 5.1 mmol/L Final  . Chloride 07/24/2014 101  101 - 111 mmol/L Final  . CO2 07/24/2014 26  22 - 32 mmol/L Final  . Glucose, Bld 07/24/2014 160* 65 - 99 mg/dL Final  . BUN 07/24/2014 11  6 - 20 mg/dL Final  . Creatinine, Ser 07/24/2014 0.61  0.44 - 1.00 mg/dL Final  . Calcium 07/24/2014 8.9  8.9 - 10.3 mg/dL Final  . Total Protein 07/24/2014 7.6  6.5 - 8.1 g/dL Final  . Albumin 07/24/2014 3.6  3.5 - 5.0 g/dL Final  . AST 07/24/2014 21  15 - 41 U/L Final  . ALT 07/24/2014 16  14 - 54 U/L Final  . Alkaline Phosphatase 07/24/2014 94  38 - 126 U/L Final  . Total Bilirubin 07/24/2014 0.5  0.3 - 1.2 mg/dL Final  . GFR calc non Af Amer 07/24/2014 >60  >60 mL/min Final  . GFR calc Af Amer 07/24/2014 >60  >60 mL/min Final   Comment: (NOTE) The eGFR has been calculated using the CKD EPI  equation. This calculation has not been validated in all clinical situations. eGFR's persistently <60 mL/min signify possible Chronic Kidney Disease.   . Anion gap 07/24/2014 7  5 - 15 Final  . Magnesium 07/24/2014 1.3* 1.7 - 2.4 mg/dL Final   Comment: RESULT REPEATED AND VERIFIED RESULT CALLED TO, READ BACK BY AND VERIFIED WITH: HAYLEY RHOAD [id number] 07/24/14 L[facility]     Lab Results  Component Value Date   CA125 14.4 07/10/2014     ASSESSMENT: Stage IV endometrial cancer Hypomagnesemia Muscle spasm in upper extremity may be secondary to hypomagnesemia  MEDICAL DECISION MAKING:  1.  All lab data has been reviewed. Patient will receive IV Toradol IV lorazepam And 4 g of magnesium intravenously Patient was examined after 2 hours needing diffuse in Center Pain has improved Muscle spasm and improved Patient was then discharged and advised to call me if symptoms recurs  Patient expressed understanding and was in agreement with this plan. She also understands that She can call clinic at any time with any questions, concerns, or complaints.    Cancer of uterus   Staging form: Corpus Uteri - Carcinoma, AJCC 7th Edition     Clinical: Stage IVB (T3b, N0, M1) - Unsigned   Forest Gleason, MD   07/29/2014 5:40 PM

## 2014-07-31 ENCOUNTER — Inpatient Hospital Stay: Payer: Medicaid Other

## 2014-07-31 ENCOUNTER — Inpatient Hospital Stay (HOSPITAL_BASED_OUTPATIENT_CLINIC_OR_DEPARTMENT_OTHER): Payer: Medicaid Other

## 2014-07-31 ENCOUNTER — Inpatient Hospital Stay (HOSPITAL_BASED_OUTPATIENT_CLINIC_OR_DEPARTMENT_OTHER): Payer: Medicaid Other | Admitting: Oncology

## 2014-07-31 ENCOUNTER — Encounter: Payer: Self-pay | Admitting: Oncology

## 2014-07-31 VITALS — BP 130/77 | HR 87 | Resp 18

## 2014-07-31 VITALS — BP 146/82 | HR 101 | Resp 22 | Ht 63.8 in | Wt 200.0 lb

## 2014-07-31 DIAGNOSIS — J449 Chronic obstructive pulmonary disease, unspecified: Secondary | ICD-10-CM | POA: Diagnosis not present

## 2014-07-31 DIAGNOSIS — F329 Major depressive disorder, single episode, unspecified: Secondary | ICD-10-CM

## 2014-07-31 DIAGNOSIS — G629 Polyneuropathy, unspecified: Secondary | ICD-10-CM

## 2014-07-31 DIAGNOSIS — C541 Malignant neoplasm of endometrium: Secondary | ICD-10-CM | POA: Diagnosis not present

## 2014-07-31 DIAGNOSIS — D751 Secondary polycythemia: Secondary | ICD-10-CM

## 2014-07-31 DIAGNOSIS — E119 Type 2 diabetes mellitus without complications: Secondary | ICD-10-CM

## 2014-07-31 DIAGNOSIS — Z5111 Encounter for antineoplastic chemotherapy: Secondary | ICD-10-CM | POA: Diagnosis not present

## 2014-07-31 DIAGNOSIS — C55 Malignant neoplasm of uterus, part unspecified: Secondary | ICD-10-CM

## 2014-07-31 DIAGNOSIS — M129 Arthropathy, unspecified: Secondary | ICD-10-CM

## 2014-07-31 DIAGNOSIS — D72829 Elevated white blood cell count, unspecified: Secondary | ICD-10-CM

## 2014-07-31 DIAGNOSIS — F419 Anxiety disorder, unspecified: Secondary | ICD-10-CM

## 2014-07-31 DIAGNOSIS — F17210 Nicotine dependence, cigarettes, uncomplicated: Secondary | ICD-10-CM

## 2014-07-31 DIAGNOSIS — C574 Malignant neoplasm of uterine adnexa, unspecified: Secondary | ICD-10-CM

## 2014-07-31 DIAGNOSIS — D473 Essential (hemorrhagic) thrombocythemia: Secondary | ICD-10-CM

## 2014-07-31 LAB — COMPREHENSIVE METABOLIC PANEL
ALK PHOS: 97 U/L (ref 38–126)
ALT: 15 U/L (ref 14–54)
AST: 24 U/L (ref 15–41)
Albumin: 3.3 g/dL — ABNORMAL LOW (ref 3.5–5.0)
Anion gap: 9 (ref 5–15)
BILIRUBIN TOTAL: 0.3 mg/dL (ref 0.3–1.2)
BUN: 22 mg/dL — ABNORMAL HIGH (ref 6–20)
CHLORIDE: 97 mmol/L — AB (ref 101–111)
CO2: 27 mmol/L (ref 22–32)
Calcium: 8.8 mg/dL — ABNORMAL LOW (ref 8.9–10.3)
Creatinine, Ser: 0.67 mg/dL (ref 0.44–1.00)
GFR calc Af Amer: >60 mL/min (ref >60)
GFR calc non Af Amer: >60 mL/min (ref >60)
Glucose, Bld: 206 mg/dL — ABNORMAL HIGH (ref 65–99)
POTASSIUM: 3.6 mmol/L (ref 3.5–5.1)
Sodium: 133 mmol/L — ABNORMAL LOW (ref 135–145)
TOTAL PROTEIN: 7.6 g/dL (ref 6.5–8.1)

## 2014-07-31 LAB — CBC WITH DIFFERENTIAL/PLATELET
BASOS ABS: 0 10*3/uL (ref 0–0.1)
BASOS PCT: 0 %
Eosinophils Absolute: 0.1 10*3/uL (ref 0–0.7)
Eosinophils Relative: 2 %
HCT: 31.2 % — ABNORMAL LOW (ref 35.0–47.0)
HEMOGLOBIN: 10.7 g/dL — AB (ref 12.0–16.0)
LYMPHS ABS: 1 10*3/uL (ref 1.0–3.6)
Lymphocytes Relative: 21 %
MCH: 34.2 pg — ABNORMAL HIGH (ref 26.0–34.0)
MCHC: 34.2 g/dL (ref 32.0–36.0)
MCV: 100.2 fL — ABNORMAL HIGH (ref 80.0–100.0)
MONO ABS: 0.6 10*3/uL (ref 0.2–0.9)
MONOS PCT: 11 %
NEUTROS ABS: 3.3 10*3/uL (ref 1.4–6.5)
NEUTROS PCT: 66 %
Platelets: 170 10*3/uL (ref 150–440)
RBC: 3.12 MIL/uL — AB (ref 3.80–5.20)
RDW: 20.1 % — ABNORMAL HIGH (ref 11.5–14.5)
WBC: 5 10*3/uL (ref 3.6–11.0)

## 2014-07-31 LAB — MAGNESIUM: MAGNESIUM: 1.4 mg/dL — AB (ref 1.7–2.4)

## 2014-07-31 MED ORDER — PALONOSETRON HCL INJECTION 0.25 MG/5ML
0.2500 mg | Freq: Once | INTRAVENOUS | Status: AC
  Administered 2014-07-31: 0.25 mg via INTRAVENOUS
  Filled 2014-07-31: qty 5

## 2014-07-31 MED ORDER — LORAZEPAM 2 MG/ML IJ SOLN
1.0000 mg | Freq: Once | INTRAMUSCULAR | Status: AC
  Administered 2014-07-31: 1 mg via INTRAVENOUS
  Filled 2014-07-31: qty 1

## 2014-07-31 MED ORDER — SODIUM CHLORIDE 0.9 % IV SOLN
Freq: Once | INTRAVENOUS | Status: AC
  Administered 2014-07-31: 11:00:00 via INTRAVENOUS
  Filled 2014-07-31: qty 250

## 2014-07-31 MED ORDER — FAMOTIDINE IN NACL 20-0.9 MG/50ML-% IV SOLN
20.0000 mg | Freq: Once | INTRAVENOUS | Status: AC
  Administered 2014-07-31: 20 mg via INTRAVENOUS
  Filled 2014-07-31: qty 50

## 2014-07-31 MED ORDER — MAGNESIUM SULFATE 2 GM/50ML IV SOLN
2.0000 g | Freq: Once | INTRAVENOUS | Status: AC
  Administered 2014-07-31: 2 g via INTRAVENOUS
  Filled 2014-07-31: qty 50

## 2014-07-31 MED ORDER — SODIUM CHLORIDE 0.9 % IJ SOLN
10.0000 mL | INTRAMUSCULAR | Status: DC | PRN
  Administered 2014-07-31: 10 mL via INTRAVENOUS
  Filled 2014-07-31: qty 10

## 2014-07-31 MED ORDER — SODIUM CHLORIDE 0.9 % IV SOLN
Freq: Once | INTRAVENOUS | Status: AC
  Administered 2014-07-31: 12:00:00 via INTRAVENOUS
  Filled 2014-07-31: qty 5

## 2014-07-31 MED ORDER — SODIUM CHLORIDE 0.9 % IV SOLN
564.0000 mg | Freq: Once | INTRAVENOUS | Status: AC
  Administered 2014-07-31: 560 mg via INTRAVENOUS
  Filled 2014-07-31: qty 56

## 2014-07-31 MED ORDER — MAGNESIUM CHLORIDE 64 MG PO TBEC: 1.0000 | DELAYED_RELEASE_TABLET | Freq: Three times a day (TID) | ORAL | Status: DC

## 2014-07-31 MED ORDER — SODIUM CHLORIDE 0.9 % IV SOLN: Freq: Once | INTRAVENOUS | Status: DC

## 2014-07-31 MED ORDER — DIPHENHYDRAMINE HCL 50 MG/ML IJ SOLN
50.0000 mg | Freq: Once | INTRAMUSCULAR | Status: AC
  Administered 2014-07-31: 50 mg via INTRAVENOUS
  Filled 2014-07-31: qty 1

## 2014-07-31 MED ORDER — HEPARIN SOD (PORK) LOCK FLUSH 100 UNIT/ML IV SOLN
500.0000 [IU] | Freq: Once | INTRAVENOUS | Status: AC
  Administered 2014-07-31: 500 [IU] via INTRAVENOUS
  Filled 2014-07-31: qty 5

## 2014-07-31 MED ORDER — PACLITAXEL CHEMO INJECTION 300 MG/50ML
175.0000 mg/m2 | Freq: Once | INTRAVENOUS | Status: AC
  Administered 2014-07-31: 360 mg via INTRAVENOUS
  Filled 2014-07-31: qty 60

## 2014-07-31 NOTE — Progress Notes (Signed)
[place] @ [facility] [phone]  [phone]     Zoe [name] OB: 01-10-1961  MR#: [id number]  KXF#:[id number]  Patient Care Team: Sharyne Peach, MD as PCP - General (Family Medicine)  CHIEF COMPLAINT:  Chief Complaint  Patient presents with  . Follow-up    "here for carbo/taxol treatment"    Oncology History   Chief Complaint/Diagnosis:   1. Endometroid adenocarcinoma, stage IIIC, grade 3, TAHBSO and complete staging. 2. Carboplatin and paclitaxel x 3.  XRT, treatments delayed due to multiple factors. 3. CT 03/20/14 Findings are compatible with widespread metastatic disease.Specifically, there appears to be local recurrence of disease in the low anatomic pelvis with and ill-defined amorphous soft tissue mass extending from the right side of the vaginal apex to the pelvic sidewall, causing some mild obstruction at the junction of distal and middle third of the right ureter (with mild proximal right hydroureteronephrosis), widespread omental and intraperitoneal metastases, retroperitoneal lymphadenopathy and new pulmonary nodules, as detailed above. 4. February 2016, Biopsy of abdominal mass is positive for malignancy consistent with endometrial cancer. 5. Started Carboplatin AUC 5 and Taxol 164m/m2, 04/17/2014.     Cancer of uterine adnexa   07/10/2014 Initial Diagnosis Cancer of uterine adnexa    Cancer of uterus   03/09/2014 Initial Diagnosis Cancer of uterus    Oncology Flowsheet 07/10/2014 07/24/2014  Day, Cycle Day 1, 4 -  CARBOplatin (PARAPLATIN) IV 680 mg -  dexamethasone (DECADRON) IV [ 12 mg ] -  fosaprepitant (EMEND) IV [ 150 mg ] -  LORazepam (ATIVAN) IV 1 mg 1 mg  PACLitaxel (TAXOL) IV 200 mg/m2 -  palonosetron (ALOXI) IV 0.25 mg -    INTERVAL HISTORY: 54year old lady came today further follow-up regarding her recurrent endometrial cancer.  Patient does not have any chills fever appetite has been fairly good.  No nausea no vomiting diarrhea.   Most of the upper extremity cramps has resolved.  The patient was advised to take Slow-Mag 3 times a day.  REVIEW OF SYSTEMS:   GENERAL:  Feels good.  Active.  No fevers, sweats or weight loss. PERFORMANCE STATUS (ECOG):  01 HEENT:  No visual changes, runny nose, sore throat, mouth sores or tenderness. Lungs: No shortness of breath or cough.  No hemoptysis. Cardiac:  No chest pain, palpitations, orthopnea, or PND. GI:  No nausea, vomiting, diarrhea, constipation, melena or hematochezia. GU:  No urgency, frequency, dysuria, or hematuria. Musculoskeletal:  No back pain.  No joint pain.  No muscle tenderness. Extremities:  No pain or swelling. Skin:  No rashes or skin changes. Neuro:  No headache, numbness or weakness, balance or coordination issues. Endocrine:  No diabetes, thyroid issues, hot flashes or night sweats. Psych:  No mood changes, depression or anxiety. Pain:  No focal pain. Review of systems:  All other systems reviewed and found to be negative.  As per HPI. Otherwise, a complete review of systems is negatve.     Additional Past Medical and Surgical History: Past Medical History/Past Surgical History -  Diabetes mellitus  Chronic obstructive pulmonary disease  Allergic rhinitis  Depression  Chronic smoker  Chronic leukocytosis, thrombocytosis, erythrocytosis  Proteinuria, on Accupril started in 2003  Venous stasis  Peripheral neuropathy  Stomach ulcer 1980 with GI bleed  Arthritis right shoulder  Cholecystectomy  Anxiety/depression  Cataracts    Family History - remarkable for diabetes, hyperlipidemia, hypertension, heart disease, bladder cancer and leukemia.    Social History - chronic smoker 45-pack-years, ongoing.  Denies  alcohol or recreational drug usage.  Physically active and ambulatory.      ADVANCED DIRECTIVES: Patient does not have any advanced healthcare directive. Information has been given.   HEALTH MAINTENANCE: History  Substance Use Topics    . Smoking status: Current Every Day Smoker  . Smokeless tobacco: Not on file  . Alcohol Use: Not on file     Allergies  Allergen Reactions  . Aspirin     Other reaction(s): Unknown  . Codeine     Other reaction(s): Unknown    Current Outpatient Prescriptions  Medication Sig Dispense Refill  . ADVAIR DISKUS 250-50 MCG/DOSE AEPB Inhale 1 puff into the lungs every morning.   1  . B-D ULTRAFINE III SHORT PEN 31G X 8 MM MISC   0  . cyclobenzaprine (FLEXERIL) 5 MG tablet Take 1 tablet (5 mg total) by mouth 3 (three) times daily as needed for muscle spasms. 60 tablet 3  . furosemide (LASIX) 20 MG tablet Take 20 mg by mouth. take 2 tablets by mouth once daily    . gabapentin (NEURONTIN) 300 MG capsule 300 mg 4 (four) times daily.   1  . glipiZIDE (GLUCOTROL XL) 5 MG 24 hr tablet Take 5 mg by mouth daily with breakfast.   1  . HYDROcodone-acetaminophen (NORCO) 10-325 MG per tablet Take 1 tablet by mouth every 6 (six) hours as needed. 60 tablet 0  . LANTUS SOLOSTAR 100 UNIT/ML Solostar Pen   1  . lisinopril (PRINIVIL,ZESTRIL) 10 MG tablet Take 10 mg by mouth daily.   1  . metFORMIN (GLUCOPHAGE) 1000 MG tablet Take 1,000 mg by mouth 2 (two) times daily with a meal.   0  . montelukast (SINGULAIR) 10 MG tablet Take 10 mg by mouth every evening.  0  . omeprazole (PRILOSEC) 20 MG capsule Take 20 mg by mouth daily.   0  . ondansetron (ZOFRAN) 4 MG tablet Take 4 mg by mouth every 8 (eight) hours as needed for nausea, vomiting or refractory nausea / vomiting.   0  . PROAIR HFA 108 (90 BASE) MCG/ACT inhaler Inhale 4 puffs into the lungs as needed.   1  . simvastatin (ZOCOR) 20 MG tablet Take 20 mg by mouth daily at 6 PM.     . traZODone (DESYREL) 50 MG tablet Take 50 mg by mouth at bedtime.   0   No current facility-administered medications for this visit.   Facility-Administered Medications Ordered in Other Visits  Medication Dose Route Frequency Provider Last Rate Last Dose  . heparin lock  flush 100 unit/mL  500 Units Intravenous Once Forest Gleason, MD      . sodium chloride 0.9 % injection 10 mL  10 mL Intravenous PRN Forest Gleason, MD   10 mL at 07/31/14 0937    OBJECTIVE:  Filed Vitals:   07/31/14 0946  BP: 146/82  Pulse: 101  Resp: 22     Body mass index is 34.52 kg/(m^2).    ECOG FS:1 - Symptomatic but completely ambulatory  PHYSICAL EXAM: General status: Performance status is good.  Patient has not lost significant weight HEENT: No evidence of stomatitis.  And alopecia Sclera and conjunctivae :: No jaundice.   pale looking . Lungs: Air  entry equal on both sides.  No rhonchi.  No rales.  Cardiac: Heart sounds are normal.  No pericardial rub.  No murmur. Lymphatic system: Cervical, axillary, inguinal, lymph nodes not palpable GI: Abdomen is soft.  No ascites.  Liver spleen not  palpable.  No tenderness.  Bowel sounds are within normal limit Lower extremity: No edema Neurological system: Higher functions, cranial nerves intact.  Peripheral neuropathy  No evidence of peripheral neuropathy. Skin: No rash.  No ecchymosis.[facility]iatric system : Patient's mood has improved.   LAB RESULTS:  Infusion on 07/31/2014  Component Date Value Ref Range Status  . WBC 07/31/2014 5.0  3.6 - 11.0 K/uL Final   A-LINE DRAW  . RBC 07/31/2014 3.12* 3.80 - 5.20 MIL/uL Final  . Hemoglobin 07/31/2014 10.7* 12.0 - 16.0 g/dL Final  . HCT 07/31/2014 31.2* 35.0 - 47.0 % Final  . MCV 07/31/2014 100.2* 80.0 - 100.0 fL Final  . MCH 07/31/2014 34.2* 26.0 - 34.0 pg Final  . MCHC 07/31/2014 34.2  32.0 - 36.0 g/dL Final  . RDW 07/31/2014 20.1* 11.5 - 14.5 % Final  . Platelets 07/31/2014 170  150 - 440 K/uL Final  . Neutrophils Relative % 07/31/2014 66   Final  . Neutro Abs 07/31/2014 3.3  1.4 - 6.5 K/uL Final  . Lymphocytes Relative 07/31/2014 21   Final  . Lymphs Abs 07/31/2014 1.0  1.0 - 3.6 K/uL Final  . Monocytes Relative 07/31/2014 11   Final  . Monocytes Absolute 07/31/2014 0.6  0.2  - 0.9 K/uL Final  . Eosinophils Relative 07/31/2014 2   Final  . Eosinophils Absolute 07/31/2014 0.1  0 - 0.7 K/uL Final  . Basophils Relative 07/31/2014 0   Final  . Basophils Absolute 07/31/2014 0.0  0 - 0.1 K/uL Final  . Sodium 07/31/2014 133* 135 - 145 mmol/L Final  . Potassium 07/31/2014 3.6  3.5 - 5.1 mmol/L Final  . Chloride 07/31/2014 97* 101 - 111 mmol/L Final  . CO2 07/31/2014 27  22 - 32 mmol/L Final  . Glucose, Bld 07/31/2014 206* 65 - 99 mg/dL Final  . BUN 07/31/2014 22* 6 - 20 mg/dL Final  . Creatinine, Ser 07/31/2014 0.67  0.44 - 1.00 mg/dL Final  . Calcium 07/31/2014 8.8* 8.9 - 10.3 mg/dL Final  . Total Protein 07/31/2014 7.6  6.5 - 8.1 g/dL Final  . Albumin 07/31/2014 3.3* 3.5 - 5.0 g/dL Final  . AST 07/31/2014 24  15 - 41 U/L Final  . ALT 07/31/2014 15  14 - 54 U/L Final  . Alkaline Phosphatase 07/31/2014 97  38 - 126 U/L Final  . Total Bilirubin 07/31/2014 0.3  0.3 - 1.2 mg/dL Final  . GFR calc non Af Amer 07/31/2014 >60  >60 mL/min Final  . GFR calc Af Amer 07/31/2014 >60  >60 mL/min Final   Comment: (NOTE) The eGFR has been calculated using the CKD EPI equation. This calculation has not been validated in all clinical situations. eGFR's persistently <60 mL/min signify possible Chronic Kidney Disease.   . Anion gap 07/31/2014 9  5 - 15 Final    Lab Results  Component Value Date   CA125 14.4 07/10/2014     ASSESSMENT: Stage IV endometrial cancer We will continue last in the sixth cycle of chemotherapy MEDICAL DECISION MAKING:  1.  All lab data has been reviewed.    Patient expressed understanding and was in agreement with this plan. She also understands that She can call clinic at any time with any questions, concerns, or complaints.    Cancer of uterus   Staging form: Corpus Uteri - Carcinoma, AJCC 7th Edition     Clinical: Stage IVB (T3b, N0, M1) - Unsigned   Forest Gleason, MD   07/31/2014 10:32 AM

## 2014-08-01 LAB — CA 125: CA 125: 15.4 U/mL (ref 0.0–38.1)

## 2014-08-07 ENCOUNTER — Other Ambulatory Visit: Payer: Medicaid Other

## 2014-08-08 ENCOUNTER — Other Ambulatory Visit: Payer: Medicaid Other

## 2014-08-09 ENCOUNTER — Inpatient Hospital Stay: Payer: Medicaid Other | Attending: Oncology

## 2014-08-09 DIAGNOSIS — C55 Malignant neoplasm of uterus, part unspecified: Secondary | ICD-10-CM | POA: Insufficient documentation

## 2014-08-14 ENCOUNTER — Other Ambulatory Visit: Payer: Medicaid Other

## 2014-08-15 ENCOUNTER — Inpatient Hospital Stay: Payer: Medicaid Other | Attending: Oncology

## 2014-08-15 ENCOUNTER — Telehealth: Payer: Self-pay | Admitting: *Deleted

## 2014-08-15 ENCOUNTER — Other Ambulatory Visit: Payer: Self-pay | Admitting: *Deleted

## 2014-08-15 DIAGNOSIS — C55 Malignant neoplasm of uterus, part unspecified: Secondary | ICD-10-CM

## 2014-08-15 LAB — CBC WITH DIFFERENTIAL/PLATELET
Basophils Absolute: 0 10*3/uL (ref 0–0.1)
Basophils Relative: 1 %
Eosinophils Absolute: 0.1 10*3/uL (ref 0–0.7)
Eosinophils Relative: 3 %
HEMATOCRIT: 30.3 % — AB (ref 35.0–47.0)
Hemoglobin: 10.2 g/dL — ABNORMAL LOW (ref 12.0–16.0)
LYMPHS ABS: 1 10*3/uL (ref 1.0–3.6)
Lymphocytes Relative: 35 %
MCH: 34.9 pg — ABNORMAL HIGH (ref 26.0–34.0)
MCHC: 33.7 g/dL (ref 32.0–36.0)
MCV: 103.6 fL — ABNORMAL HIGH (ref 80.0–100.0)
MONOS PCT: 15 %
Monocytes Absolute: 0.4 10*3/uL (ref 0.2–0.9)
Neutro Abs: 1.2 10*3/uL — ABNORMAL LOW (ref 1.4–6.5)
Neutrophils Relative %: 46 %
Platelets: 137 10*3/uL — ABNORMAL LOW (ref 150–440)
RBC: 2.93 MIL/uL — ABNORMAL LOW (ref 3.80–5.20)
RDW: 17 % — AB (ref 11.5–14.5)
WBC: 2.7 10*3/uL — ABNORMAL LOW (ref 3.6–11.0)

## 2014-08-15 LAB — MAGNESIUM: Magnesium: 1.3 mg/dL — ABNORMAL LOW (ref 1.7–2.4)

## 2014-08-15 MED ORDER — MAGNESIUM CHLORIDE 64 MG PO TBEC: 2.0000 | DELAYED_RELEASE_TABLET | Freq: Three times a day (TID) | ORAL | Status: DC

## 2014-08-15 NOTE — Telephone Encounter (Signed)
Pt needs to be scheduled for IV mag on 6/16 and needs to increase slow-mag to 2 tabs TID per L. Herring, NP. Left voicemail on pt's home number to call me back to discuss lab results from today and replacement therapy for low mag. Awaiting callback.

## 2014-08-16 ENCOUNTER — Inpatient Hospital Stay: Payer: Medicaid Other | Attending: Oncology

## 2014-08-16 VITALS — BP 97/65 | HR 92 | Temp 96.1°F | Resp 18

## 2014-08-16 DIAGNOSIS — C55 Malignant neoplasm of uterus, part unspecified: Secondary | ICD-10-CM

## 2014-08-16 DIAGNOSIS — C574 Malignant neoplasm of uterine adnexa, unspecified: Secondary | ICD-10-CM | POA: Insufficient documentation

## 2014-08-16 MED ORDER — HYDROCODONE-ACETAMINOPHEN 10-325 MG PO TABS: 1.0000 | ORAL_TABLET | Freq: Four times a day (QID) | ORAL | Status: DC | PRN

## 2014-08-16 MED ORDER — MAGNESIUM SULFATE 2 GM/50ML IV SOLN
2.0000 g | Freq: Once | INTRAVENOUS | Status: AC
  Administered 2014-08-16: 2 g via INTRAVENOUS
  Filled 2014-08-16: qty 50

## 2014-08-16 MED ORDER — SODIUM CHLORIDE 0.9 % IJ SOLN
10.0000 mL | INTRAMUSCULAR | Status: AC | PRN
  Administered 2014-08-16: 10 mL
  Filled 2014-08-16: qty 10

## 2014-08-16 MED ORDER — HEPARIN SOD (PORK) LOCK FLUSH 100 UNIT/ML IV SOLN
500.0000 [IU] | INTRAVENOUS | Status: AC | PRN
  Administered 2014-08-16: 500 [IU]

## 2014-08-16 MED ORDER — HEPARIN SOD (PORK) LOCK FLUSH 100 UNIT/ML IV SOLN
INTRAVENOUS | Status: AC
  Filled 2014-08-16: qty 5

## 2014-08-16 MED ORDER — MAGNESIUM 250 MG PO TABS: 2.0000 | ORAL_TABLET | Freq: Every day | ORAL | Status: AC

## 2014-08-16 NOTE — Telephone Encounter (Signed)
Left message with pt's mother to confirm appt today. Awaiting callback.

## 2014-08-16 NOTE — Telephone Encounter (Signed)
Spoke with pt at clinic and informed her about low magnesium and will be getting mag infusion today. Pt states started taking OTC mag supplement yesterday. Pt was instructed to continue mag supplement at this time and labs will be re-evaluated at next visit. Pt verbalized understanding.

## 2014-08-21 ENCOUNTER — Other Ambulatory Visit: Payer: Medicaid Other

## 2014-08-22 ENCOUNTER — Inpatient Hospital Stay: Payer: Medicaid Other

## 2014-08-22 DIAGNOSIS — C55 Malignant neoplasm of uterus, part unspecified: Secondary | ICD-10-CM

## 2014-08-22 LAB — COMPREHENSIVE METABOLIC PANEL
ALK PHOS: 102 U/L (ref 38–126)
ALT: 11 U/L — ABNORMAL LOW (ref 14–54)
AST: 17 U/L (ref 15–41)
Albumin: 3.3 g/dL — ABNORMAL LOW (ref 3.5–5.0)
Anion gap: 8 (ref 5–15)
BILIRUBIN TOTAL: 0.3 mg/dL (ref 0.3–1.2)
BUN: 24 mg/dL — ABNORMAL HIGH (ref 6–20)
CHLORIDE: 98 mmol/L — AB (ref 101–111)
CO2: 29 mmol/L (ref 22–32)
Calcium: 8.6 mg/dL — ABNORMAL LOW (ref 8.9–10.3)
Creatinine, Ser: 0.79 mg/dL (ref 0.44–1.00)
GFR calc Af Amer: >60 mL/min (ref >60)
GFR calc non Af Amer: >60 mL/min (ref >60)
GLUCOSE: 144 mg/dL — AB (ref 65–99)
POTASSIUM: 4.1 mmol/L (ref 3.5–5.1)
Sodium: 135 mmol/L (ref 135–145)
Total Protein: 7.4 g/dL (ref 6.5–8.1)

## 2014-08-22 LAB — CBC WITH DIFFERENTIAL/PLATELET
BASOS ABS: 0.1 10*3/uL (ref 0–0.1)
Basophils Relative: 1 %
Eosinophils Absolute: 0.1 10*3/uL (ref 0–0.7)
Eosinophils Relative: 1 %
HCT: 32 % — ABNORMAL LOW (ref 35.0–47.0)
Hemoglobin: 10.7 g/dL — ABNORMAL LOW (ref 12.0–16.0)
LYMPHS PCT: 20 %
Lymphs Abs: 1.1 10*3/uL (ref 1.0–3.6)
MCH: 35 pg — ABNORMAL HIGH (ref 26.0–34.0)
MCHC: 33.6 g/dL (ref 32.0–36.0)
MCV: 104.3 fL — AB (ref 80.0–100.0)
MONO ABS: 0.6 10*3/uL (ref 0.2–0.9)
Monocytes Relative: 11 %
NEUTROS ABS: 3.6 10*3/uL (ref 1.4–6.5)
Neutrophils Relative %: 67 %
Platelets: 152 10*3/uL (ref 150–440)
RBC: 3.07 MIL/uL — ABNORMAL LOW (ref 3.80–5.20)
RDW: 16 % — AB (ref 11.5–14.5)
WBC: 5.3 10*3/uL (ref 3.6–11.0)

## 2014-08-23 LAB — CA 125: CA 125: 15.4 U/mL (ref 0.0–38.1)

## 2014-08-27 ENCOUNTER — Ambulatory Visit: Admission: RE | Admit: 2014-08-27 | Payer: Medicaid Other | Source: Ambulatory Visit

## 2014-08-27 ENCOUNTER — Ambulatory Visit: Payer: Medicaid Other

## 2014-08-30 ENCOUNTER — Ambulatory Visit: Payer: Medicaid Other

## 2014-09-12 ENCOUNTER — Other Ambulatory Visit: Payer: Medicaid Other

## 2014-09-12 ENCOUNTER — Ambulatory Visit: Payer: Medicaid Other | Admitting: Oncology

## 2014-09-17 ENCOUNTER — Ambulatory Visit: Payer: Medicaid Other

## 2014-09-19 ENCOUNTER — Other Ambulatory Visit: Payer: Medicaid Other

## 2014-09-19 ENCOUNTER — Ambulatory Visit: Payer: Medicaid Other | Admitting: Oncology

## 2014-09-20 ENCOUNTER — Ambulatory Visit: Payer: Medicaid Other

## 2014-09-24 ENCOUNTER — Ambulatory Visit: Payer: Medicaid Other | Admitting: Oncology

## 2014-09-24 ENCOUNTER — Other Ambulatory Visit: Payer: Medicaid Other

## 2014-09-26 ENCOUNTER — Other Ambulatory Visit: Payer: Self-pay | Admitting: Oncology

## 2014-10-10 ENCOUNTER — Ambulatory Visit
Admission: RE | Admit: 2014-10-10 | Discharge: 2014-10-10 | Disposition: A | Discharge status: Home / Self Care | Payer: Medicaid Other | Source: Ambulatory Visit | Attending: Oncology | Admitting: Oncology

## 2014-10-10 DIAGNOSIS — R918 Other nonspecific abnormal finding of lung field: Secondary | ICD-10-CM | POA: Diagnosis not present

## 2014-10-10 DIAGNOSIS — C55 Malignant neoplasm of uterus, part unspecified: Secondary | ICD-10-CM | POA: Diagnosis present

## 2014-10-10 DIAGNOSIS — C786 Secondary malignant neoplasm of retroperitoneum and peritoneum: Secondary | ICD-10-CM | POA: Diagnosis not present

## 2014-10-10 HISTORY — DX: Type 2 diabetes mellitus without complications: E11.9

## 2014-10-10 HISTORY — DX: Malignant (primary) neoplasm, unspecified: C80.1

## 2014-10-10 MED ORDER — IOHEXOL 350 MG/ML SOLN
100.0000 mL | Freq: Once | INTRAVENOUS | Status: AC | PRN
  Administered 2014-10-10: 100 mL via INTRAVENOUS

## 2014-10-11 ENCOUNTER — Inpatient Hospital Stay: Payer: Medicaid Other | Attending: Oncology | Admitting: Oncology

## 2014-10-11 ENCOUNTER — Encounter: Payer: Self-pay | Admitting: Oncology

## 2014-10-11 ENCOUNTER — Inpatient Hospital Stay: Payer: Medicaid Other

## 2014-10-11 ENCOUNTER — Other Ambulatory Visit: Payer: Self-pay | Admitting: *Deleted

## 2014-10-11 VITALS — BP 107/67 | HR 101 | Temp 95.5°F | Wt 194.1 lb

## 2014-10-11 DIAGNOSIS — Z794 Long term (current) use of insulin: Secondary | ICD-10-CM | POA: Diagnosis not present

## 2014-10-11 DIAGNOSIS — Z8711 Personal history of peptic ulcer disease: Secondary | ICD-10-CM | POA: Insufficient documentation

## 2014-10-11 DIAGNOSIS — C55 Malignant neoplasm of uterus, part unspecified: Secondary | ICD-10-CM

## 2014-10-11 DIAGNOSIS — G629 Polyneuropathy, unspecified: Secondary | ICD-10-CM | POA: Diagnosis not present

## 2014-10-11 DIAGNOSIS — Z9049 Acquired absence of other specified parts of digestive tract: Secondary | ICD-10-CM | POA: Diagnosis not present

## 2014-10-11 DIAGNOSIS — D751 Secondary polycythemia: Secondary | ICD-10-CM | POA: Diagnosis not present

## 2014-10-11 DIAGNOSIS — J449 Chronic obstructive pulmonary disease, unspecified: Secondary | ICD-10-CM | POA: Diagnosis not present

## 2014-10-11 DIAGNOSIS — I878 Other specified disorders of veins: Secondary | ICD-10-CM | POA: Insufficient documentation

## 2014-10-11 DIAGNOSIS — C541 Malignant neoplasm of endometrium: Secondary | ICD-10-CM | POA: Insufficient documentation

## 2014-10-11 DIAGNOSIS — R918 Other nonspecific abnormal finding of lung field: Secondary | ICD-10-CM | POA: Diagnosis not present

## 2014-10-11 DIAGNOSIS — Z79899 Other long term (current) drug therapy: Secondary | ICD-10-CM | POA: Insufficient documentation

## 2014-10-11 DIAGNOSIS — Z9221 Personal history of antineoplastic chemotherapy: Secondary | ICD-10-CM | POA: Insufficient documentation

## 2014-10-11 DIAGNOSIS — C786 Secondary malignant neoplasm of retroperitoneum and peritoneum: Secondary | ICD-10-CM | POA: Diagnosis not present

## 2014-10-11 DIAGNOSIS — Z79811 Long term (current) use of aromatase inhibitors: Secondary | ICD-10-CM | POA: Insufficient documentation

## 2014-10-11 DIAGNOSIS — Z9181 History of falling: Secondary | ICD-10-CM | POA: Diagnosis not present

## 2014-10-11 DIAGNOSIS — E119 Type 2 diabetes mellitus without complications: Secondary | ICD-10-CM | POA: Insufficient documentation

## 2014-10-11 DIAGNOSIS — D473 Essential (hemorrhagic) thrombocythemia: Secondary | ICD-10-CM | POA: Insufficient documentation

## 2014-10-11 DIAGNOSIS — F419 Anxiety disorder, unspecified: Secondary | ICD-10-CM | POA: Insufficient documentation

## 2014-10-11 DIAGNOSIS — F17210 Nicotine dependence, cigarettes, uncomplicated: Secondary | ICD-10-CM | POA: Insufficient documentation

## 2014-10-11 DIAGNOSIS — D72829 Elevated white blood cell count, unspecified: Secondary | ICD-10-CM | POA: Diagnosis not present

## 2014-10-11 DIAGNOSIS — M129 Arthropathy, unspecified: Secondary | ICD-10-CM | POA: Diagnosis not present

## 2014-10-11 DIAGNOSIS — F329 Major depressive disorder, single episode, unspecified: Secondary | ICD-10-CM | POA: Diagnosis not present

## 2014-10-11 DIAGNOSIS — M79662 Pain in left lower leg: Secondary | ICD-10-CM | POA: Insufficient documentation

## 2014-10-11 LAB — CBC WITH DIFFERENTIAL/PLATELET
BASOS ABS: 0.1 10*3/uL (ref 0–0.1)
Basophils Relative: 1 %
Eosinophils Absolute: 0.1 10*3/uL (ref 0–0.7)
Eosinophils Relative: 1 %
HCT: 34.7 % — ABNORMAL LOW (ref 35.0–47.0)
HEMOGLOBIN: 12 g/dL (ref 12.0–16.0)
LYMPHS PCT: 18 %
Lymphs Abs: 1.3 10*3/uL (ref 1.0–3.6)
MCH: 34.3 pg — ABNORMAL HIGH (ref 26.0–34.0)
MCHC: 34.5 g/dL (ref 32.0–36.0)
MCV: 99.5 fL (ref 80.0–100.0)
MONOS PCT: 7 %
Monocytes Absolute: 0.5 10*3/uL (ref 0.2–0.9)
NEUTROS ABS: 5.1 10*3/uL (ref 1.4–6.5)
Neutrophils Relative %: 73 %
Platelets: 272 10*3/uL (ref 150–440)
RBC: 3.49 MIL/uL — ABNORMAL LOW (ref 3.80–5.20)
RDW: 14.2 % (ref 11.5–14.5)
WBC: 7.1 10*3/uL (ref 3.6–11.0)

## 2014-10-11 LAB — COMPREHENSIVE METABOLIC PANEL
ALBUMIN: 3.2 g/dL — AB (ref 3.5–5.0)
ALT: 10 U/L — AB (ref 14–54)
AST: 17 U/L (ref 15–41)
Alkaline Phosphatase: 119 U/L (ref 38–126)
Anion gap: 5 (ref 5–15)
BUN: 22 mg/dL — ABNORMAL HIGH (ref 6–20)
CALCIUM: 8.4 mg/dL — AB (ref 8.9–10.3)
CO2: 29 mmol/L (ref 22–32)
Chloride: 101 mmol/L (ref 101–111)
Creatinine, Ser: 0.7 mg/dL (ref 0.44–1.00)
GFR calc Af Amer: >60 mL/min (ref >60)
GFR calc non Af Amer: >60 mL/min (ref >60)
Glucose, Bld: 87 mg/dL (ref 65–99)
POTASSIUM: 3.7 mmol/L (ref 3.5–5.1)
SODIUM: 135 mmol/L (ref 135–145)
TOTAL PROTEIN: 7.5 g/dL (ref 6.5–8.1)
Total Bilirubin: 0.3 mg/dL (ref 0.3–1.2)

## 2014-10-11 MED ORDER — CALCIUM CARBONATE-VITAMIN D 600-400 MG-UNIT PO TABS: 1.0000 | ORAL_TABLET | Freq: Two times a day (BID) | ORAL | Status: AC

## 2014-10-11 MED ORDER — HYDROCODONE-ACETAMINOPHEN 10-325 MG PO TABS: 1.0000 | ORAL_TABLET | Freq: Four times a day (QID) | ORAL | Status: DC | PRN

## 2014-10-11 MED ORDER — HEPARIN SOD (PORK) LOCK FLUSH 100 UNIT/ML IV SOLN
INTRAVENOUS | Status: AC
  Filled 2014-10-11: qty 5

## 2014-10-11 MED ORDER — LETROZOLE 2.5 MG PO TABS: 2.5000 mg | ORAL_TABLET | Freq: Every day | ORAL | Status: DC

## 2014-10-11 NOTE — Progress Notes (Signed)
[place] @ [facility] [phone]  [phone]) [phone]     SHONNIE [name] OB: [birth year]  MR#: [id number]  CBU#:[id number]  Patient Care Team: Sharyne Peach, MD as PCP - General (Family Medicine)  CHIEF COMPLAINT:  Chief Complaint  Patient presents with  . Follow-up    Oncology History   Chief Complaint/Diagnosis:   1. Endometroid adenocarcinoma, stage IIIC, grade 3, TAHBSO and complete staging. 2. Carboplatin and paclitaxel x 3.  XRT, treatments delayed due to multiple factors. 3. CT 03/20/14 Findings are compatible with widespread metastatic disease.Specifically, there appears to be local recurrence of disease in the low anatomic pelvis with and ill-defined amorphous soft tissue mass extending from the right side of the vaginal apex to the pelvic sidewall, causing some mild obstruction at the junction of distal and middle third of the right ureter (with mild proximal right hydroureteronephrosis), widespread omental and intraperitoneal metastases, retroperitoneal lymphadenopathy and new pulmonary nodules, as detailed above. 4. February 2016, Biopsy of abdominal mass is positive for malignancy consistent with endometrial cancer. 5. Started Carboplatin AUC 5 and Taxol 112m/m2, 04/17/2014.     Cancer of uterine adnexa   07/10/2014 Initial Diagnosis Cancer of uterine adnexa    Cancer of uterus   03/09/2014 Initial Diagnosis Cancer of uterus    Oncology Flowsheet 07/10/2014 07/24/2014 07/31/2014  Day, Cycle Day 1, 4 - Day 1, 6  CARBOplatin (PARAPLATIN) IV 680 mg - 560 mg  dexamethasone (DECADRON) IV [ 12 mg ] - [ 12 mg ]  fosaprepitant (EMEND) IV [ 150 mg ] - [ 150 mg ]  LORazepam (ATIVAN) IV 1 mg 1 mg 1 mg  PACLitaxel (TAXOL) IV 200 mg/m2 - 175 mg/m2  palonosetron (ALOXI) IV 0.25 mg - 0.25 mg    INTERVAL HISTORY: 54year old lady came today further follow-up regarding her recurrent endometrial cancer.  Patient does not have any chills fever appetite has been fairly good.   No nausea no vomiting diarrhea.  Most of the upper extremity cramps has resolved.  The patient was advised to take Slow-Mag 3 times a day.  October 11, 2014 Patient is here for ongoing evaluation regarding recurrent stage IV carcinoma of endometrium Overall patient is feeling better.  No nausea.  No vomiting.  No diarrhea.  Patient continues to fall last times he fell in July but did not go and see any physician.  Has some pain in the left lower extremity.  But no swelling  The patient had a CT scan of abdomen done and chest was also done which has been reviewed independently Appetite has been stable  REVIEW OF SYSTEMS:   GENERAL:  Feels good.  Active.  No fevers, sweats or weight loss. PERFORMANCE STATUS (ECOG):  01 HEENT:  No visual changes, runny nose, sore throat, mouth sores or tenderness. Lungs: No shortness of breath or cough.  No hemoptysis. Cardiac:  No chest pain, palpitations, orthopnea, or PND. GI:  No nausea, vomiting, diarrhea, constipation, melena or hematochezia. GU:  No urgency, frequency, dysuria, or hematuria. Musculoskeletal:  No back pain.  No joint pain.  No muscle tenderness. Extremities:  No pain or swelling. Skin:  No rashes or skin changes. Neuro:  No headache, numbness or weakness, balance or coordination issues. Endocrine:  No diabetes, thyroid issues, hot flashes or night sweats. Psych:  No mood changes, depression or anxiety. Pain:  No focal pain. Review of systems:  All other systems reviewed and found to be negative.  As per HPI. Otherwise, a complete review  of systems is negatve.     Additional Past Medical and Surgical History: Past Medical History/Past Surgical History -  Diabetes mellitus  Chronic obstructive pulmonary disease  Allergic rhinitis  Depression  Chronic smoker  Chronic leukocytosis, thrombocytosis, erythrocytosis  Proteinuria, on Accupril started in 2003  Venous stasis  Peripheral neuropathy  Stomach ulcer 1980 with GI  bleed  Arthritis right shoulder  Cholecystectomy  Anxiety/depression  Cataracts    Family History - remarkable for diabetes, hyperlipidemia, hypertension, heart disease, bladder cancer and leukemia.    Social History - chronic smoker 45-pack-years, ongoing.  Denies alcohol or recreational drug usage.  Physically active and ambulatory.      ADVANCED DIRECTIVES: Patient does not have any advanced healthcare directive. Information has been given.   HEALTH MAINTENANCE: Social History  Substance Use Topics  . Smoking status: Current Every Day Smoker  . Smokeless tobacco: None  . Alcohol Use: None     Allergies  Allergen Reactions  . Aspirin     Other reaction(s): Unknown  . Codeine     Other reaction(s): Unknown    Current Outpatient Prescriptions  Medication Sig Dispense Refill  . ADVAIR DISKUS 250-50 MCG/DOSE AEPB Inhale 1 puff into the lungs every morning.   1  . B-D ULTRAFINE III SHORT PEN 31G X 8 MM MISC   0  . cyclobenzaprine (FLEXERIL) 5 MG tablet Take 1 tablet (5 mg total) by mouth 3 (three) times daily as needed for muscle spasms. 60 tablet 3  . furosemide (LASIX) 20 MG tablet Take 20 mg by mouth. take 2 tablets by mouth once daily    . gabapentin (NEURONTIN) 300 MG capsule 300 mg 4 (four) times daily.   1  . glipiZIDE (GLUCOTROL XL) 5 MG 24 hr tablet Take 5 mg by mouth daily with breakfast.   1  . HYDROcodone-acetaminophen (NORCO) 10-325 MG per tablet Take 1 tablet by mouth every 6 (six) hours as needed. 90 tablet 0  . LANTUS SOLOSTAR 100 UNIT/ML Solostar Pen   1  . lisinopril (PRINIVIL,ZESTRIL) 10 MG tablet Take 10 mg by mouth daily.   1  . Magnesium 250 MG TABS Take 2 tablets (500 mg total) by mouth daily.  0  . metFORMIN (GLUCOPHAGE) 1000 MG tablet Take 1,000 mg by mouth 2 (two) times daily with a meal.   0  . montelukast (SINGULAIR) 10 MG tablet Take 10 mg by mouth every evening.  0  . omeprazole (PRILOSEC) 20 MG capsule take 1 capsule by mouth once daily 30  capsule 3  . ondansetron (ZOFRAN) 4 MG tablet Take 4 mg by mouth every 8 (eight) hours as needed for nausea, vomiting or refractory nausea / vomiting.   0  . PROAIR HFA 108 (90 BASE) MCG/ACT inhaler Inhale 4 puffs into the lungs as needed.   1  . simvastatin (ZOCOR) 20 MG tablet Take 20 mg by mouth daily at 6 PM.     . traZODone (DESYREL) 50 MG tablet Take 50 mg by mouth at bedtime.   0  . Calcium Carbonate-Vitamin D (CALTRATE 600+D) 600-400 MG-UNIT per tablet Take 1 tablet by mouth 2 (two) times daily.    Marland Kitchen letrozole (FEMARA) 2.5 MG tablet Take 1 tablet (2.5 mg total) by mouth daily. 30 tablet 6   No current facility-administered medications for this visit.    OBJECTIVE:  Filed Vitals:   10/11/14 1127  BP: 107/67  Pulse: 101  Temp: 95.5 F (35.3 C)     Body  mass index is 33.51 kg/(m^2).    ECOG FS:1 - Symptomatic but completely ambulatory  PHYSICAL EXAM: General status: Performance status is good.  Patient has not lost significant weight HEENT: No evidence of stomatitis.  And alopecia Sclera and conjunctivae :: No jaundice.   pale looking . Lungs: Air  entry equal on both sides.  No rhonchi.  No rales.  Cardiac: Heart sounds are normal.  No pericardial rub.  No murmur. Lymphatic system: Cervical, axillary, inguinal, lymph nodes not palpable GI: Abdomen is soft.  No ascites.  Liver spleen not palpable.  No tenderness.  Bowel sounds are within normal limit Lower extremity: No edema Neurological system: Higher functions, cranial nerves intact.  Peripheral neuropathy  No evidence of peripheral neuropathy. Skin: No rash.  No ecchymosis.[facility] system : Patient's mood has improved.   LAB RESULTS:  Infusion on 10/11/2014  Component Date Value Ref Range Status  . WBC 10/11/2014 7.1  3.6 - 11.0 K/uL Final   A-LINE DRAW  . RBC 10/11/2014 3.49* 3.80 - 5.20 MIL/uL Final  . Hemoglobin 10/11/2014 12.0  12.0 - 16.0 g/dL Final  . HCT 10/11/2014 34.7* 35.0 - 47.0 % Final  . MCV  10/11/2014 99.5  80.0 - 100.0 fL Final  . MCH 10/11/2014 34.3* 26.0 - 34.0 pg Final  . MCHC 10/11/2014 34.5  32.0 - 36.0 g/dL Final  . RDW 10/11/2014 14.2  11.5 - 14.5 % Final  . Platelets 10/11/2014 272  150 - 440 K/uL Final  . Neutrophils Relative % 10/11/2014 73   Final  . Neutro Abs 10/11/2014 5.1  1.4 - 6.5 K/uL Final  . Lymphocytes Relative 10/11/2014 18   Final  . Lymphs Abs 10/11/2014 1.3  1.0 - 3.6 K/uL Final  . Monocytes Relative 10/11/2014 7   Final  . Monocytes Absolute 10/11/2014 0.5  0.2 - 0.9 K/uL Final  . Eosinophils Relative 10/11/2014 1   Final  . Eosinophils Absolute 10/11/2014 0.1  0 - 0.7 K/uL Final  . Basophils Relative 10/11/2014 1   Final  . Basophils Absolute 10/11/2014 0.1  0 - 0.1 K/uL Final  . Sodium 10/11/2014 135  135 - 145 mmol/L Final  . Potassium 10/11/2014 3.7  3.5 - 5.1 mmol/L Final  . Chloride 10/11/2014 101  101 - 111 mmol/L Final  . CO2 10/11/2014 29  22 - 32 mmol/L Final  . Glucose, Bld 10/11/2014 87  65 - 99 mg/dL Final  . BUN 10/11/2014 22* 6 - 20 mg/dL Final  . Creatinine, Ser 10/11/2014 0.70  0.44 - 1.00 mg/dL Final  . Calcium 10/11/2014 8.4* 8.9 - 10.3 mg/dL Final  . Total Protein 10/11/2014 7.5  6.5 - 8.1 g/dL Final  . Albumin 10/11/2014 3.2* 3.5 - 5.0 g/dL Final  . AST 10/11/2014 17  15 - 41 U/L Final  . ALT 10/11/2014 10* 14 - 54 U/L Final  . Alkaline Phosphatase 10/11/2014 119  38 - 126 U/L Final  . Total Bilirubin 10/11/2014 0.3  0.3 - 1.2 mg/dL Final  . GFR calc non Af Amer 10/11/2014 >60  >60 mL/min Final  . GFR calc Af Amer 10/11/2014 >60  >60 mL/min Final   Comment: (NOTE) The eGFR has been calculated using the CKD EPI equation. This calculation has not been validated in all clinical situations. eGFR's persistently <60 mL/min signify possible Chronic Kidney Disease.   . Anion gap 10/11/2014 5  5 - 15 Final    Lab Results  Component Value Date   CA125  15.4 08/22/2014     ASSESSMENT: Stage IV endometrial cancer Status  post 6 cycles of chemotherapy. CT scan of the chest and abdomen has been reviewed O significant in decrease in the size of pulmonary nodule as well as omental metastases MEDICAL DECISION MAKING:  1.  All lab data has been reviewed.  Review of CT scan with patient and also reviewed independently CA-125 is pending Possibility of initiating letrozole as a maintenance therapy Calcium vitamin D. Discussion regarding getting bone density and some of the side effect of joint pains Reevaluate patient in 3 months unless patient has any symptoms or if there is a rising CA-125  Patient expressed understanding and was in agreement with this plan. She also understands that She can call clinic at any time with any questions, concerns, or complaints.    Cancer of uterus   Staging form: Corpus Uteri - Carcinoma, AJCC 7th Edition     Clinical: Stage IVB (T3b, N0, M1) - Unsigned   Forest Gleason, MD   10/11/2014 3:08 PM

## 2014-10-11 NOTE — Progress Notes (Signed)
Patient does have living will.  Current every day smoker. 

## 2014-10-12 LAB — CA 125: CA 125: 25.1 U/mL (ref 0.0–38.1)

## 2014-10-23 ENCOUNTER — Ambulatory Visit: Payer: Medicaid Other

## 2014-10-25 ENCOUNTER — Ambulatory Visit: Payer: Medicaid Other | Admitting: Oncology

## 2014-10-25 ENCOUNTER — Other Ambulatory Visit: Payer: Medicaid Other

## 2014-11-09 DIAGNOSIS — Z23 Encounter for immunization: Secondary | ICD-10-CM | POA: Insufficient documentation

## 2014-11-09 DIAGNOSIS — M8580 Other specified disorders of bone density and structure, unspecified site: Secondary | ICD-10-CM | POA: Insufficient documentation

## 2014-11-13 ENCOUNTER — Other Ambulatory Visit: Payer: Self-pay | Admitting: Family Medicine

## 2014-11-13 DIAGNOSIS — M8580 Other specified disorders of bone density and structure, unspecified site: Secondary | ICD-10-CM

## 2014-11-20 ENCOUNTER — Ambulatory Visit: Payer: Medicaid Other

## 2014-11-21 ENCOUNTER — Ambulatory Visit
Admission: RE | Admit: 2014-11-21 | Discharge: 2014-11-21 | Disposition: A | Discharge status: Home / Self Care | Payer: Medicaid Other | Source: Ambulatory Visit | Attending: Family Medicine | Admitting: Family Medicine

## 2014-11-21 DIAGNOSIS — M8580 Other specified disorders of bone density and structure, unspecified site: Secondary | ICD-10-CM | POA: Insufficient documentation

## 2015-01-14 ENCOUNTER — Inpatient Hospital Stay: Payer: Medicaid Other

## 2015-01-14 ENCOUNTER — Inpatient Hospital Stay: Payer: Medicaid Other | Attending: Oncology | Admitting: Oncology

## 2015-01-14 ENCOUNTER — Encounter: Payer: Self-pay | Admitting: Oncology

## 2015-01-14 VITALS — BP 115/66 | HR 78 | Temp 96.1°F | Wt 204.1 lb

## 2015-01-14 DIAGNOSIS — Z87891 Personal history of nicotine dependence: Secondary | ICD-10-CM | POA: Insufficient documentation

## 2015-01-14 DIAGNOSIS — E119 Type 2 diabetes mellitus without complications: Secondary | ICD-10-CM | POA: Insufficient documentation

## 2015-01-14 DIAGNOSIS — F419 Anxiety disorder, unspecified: Secondary | ICD-10-CM

## 2015-01-14 DIAGNOSIS — C541 Malignant neoplasm of endometrium: Secondary | ICD-10-CM | POA: Diagnosis not present

## 2015-01-14 DIAGNOSIS — Z8052 Family history of malignant neoplasm of bladder: Secondary | ICD-10-CM | POA: Diagnosis not present

## 2015-01-14 DIAGNOSIS — D72829 Elevated white blood cell count, unspecified: Secondary | ICD-10-CM | POA: Insufficient documentation

## 2015-01-14 DIAGNOSIS — R918 Other nonspecific abnormal finding of lung field: Secondary | ICD-10-CM | POA: Insufficient documentation

## 2015-01-14 DIAGNOSIS — F17210 Nicotine dependence, cigarettes, uncomplicated: Secondary | ICD-10-CM | POA: Insufficient documentation

## 2015-01-14 DIAGNOSIS — Z7984 Long term (current) use of oral hypoglycemic drugs: Secondary | ICD-10-CM | POA: Insufficient documentation

## 2015-01-14 DIAGNOSIS — Z8711 Personal history of peptic ulcer disease: Secondary | ICD-10-CM

## 2015-01-14 DIAGNOSIS — R809 Proteinuria, unspecified: Secondary | ICD-10-CM | POA: Insufficient documentation

## 2015-01-14 DIAGNOSIS — J449 Chronic obstructive pulmonary disease, unspecified: Secondary | ICD-10-CM | POA: Diagnosis not present

## 2015-01-14 DIAGNOSIS — M129 Arthropathy, unspecified: Secondary | ICD-10-CM | POA: Insufficient documentation

## 2015-01-14 DIAGNOSIS — Z79899 Other long term (current) drug therapy: Secondary | ICD-10-CM | POA: Insufficient documentation

## 2015-01-14 DIAGNOSIS — I878 Other specified disorders of veins: Secondary | ICD-10-CM | POA: Diagnosis not present

## 2015-01-14 DIAGNOSIS — D751 Secondary polycythemia: Secondary | ICD-10-CM | POA: Insufficient documentation

## 2015-01-14 DIAGNOSIS — C55 Malignant neoplasm of uterus, part unspecified: Secondary | ICD-10-CM

## 2015-01-14 DIAGNOSIS — G629 Polyneuropathy, unspecified: Secondary | ICD-10-CM | POA: Diagnosis not present

## 2015-01-14 DIAGNOSIS — D696 Thrombocytopenia, unspecified: Secondary | ICD-10-CM | POA: Diagnosis not present

## 2015-01-14 DIAGNOSIS — F329 Major depressive disorder, single episode, unspecified: Secondary | ICD-10-CM

## 2015-01-14 DIAGNOSIS — Z794 Long term (current) use of insulin: Secondary | ICD-10-CM | POA: Insufficient documentation

## 2015-01-14 LAB — COMPREHENSIVE METABOLIC PANEL
ALT: 8 U/L — ABNORMAL LOW (ref 14–54)
AST: 14 U/L — AB (ref 15–41)
Albumin: 3.3 g/dL — ABNORMAL LOW (ref 3.5–5.0)
Alkaline Phosphatase: 104 U/L (ref 38–126)
Anion gap: 8 (ref 5–15)
BILIRUBIN TOTAL: 0.3 mg/dL (ref 0.3–1.2)
BUN: 30 mg/dL — AB (ref 6–20)
CO2: 28 mmol/L (ref 22–32)
Calcium: 9 mg/dL (ref 8.9–10.3)
Chloride: 96 mmol/L — ABNORMAL LOW (ref 101–111)
Creatinine, Ser: 0.67 mg/dL (ref 0.44–1.00)
GFR calc Af Amer: >60 mL/min (ref >60)
Glucose, Bld: 162 mg/dL — ABNORMAL HIGH (ref 65–99)
Potassium: 4.5 mmol/L (ref 3.5–5.1)
Sodium: 132 mmol/L — ABNORMAL LOW (ref 135–145)
TOTAL PROTEIN: 7.3 g/dL (ref 6.5–8.1)

## 2015-01-14 LAB — CBC WITH DIFFERENTIAL/PLATELET
Basophils Absolute: 0.1 10*3/uL (ref 0–0.1)
Basophils Relative: 1 %
Eosinophils Absolute: 0 10*3/uL (ref 0–0.7)
Eosinophils Relative: 1 %
HEMATOCRIT: 35.9 % (ref 35.0–47.0)
Hemoglobin: 12.1 g/dL (ref 12.0–16.0)
LYMPHS ABS: 1.4 10*3/uL (ref 1.0–3.6)
LYMPHS PCT: 17 %
MCH: 31.2 pg (ref 26.0–34.0)
MCHC: 33.7 g/dL (ref 32.0–36.0)
MCV: 92.6 fL (ref 80.0–100.0)
MONO ABS: 0.4 10*3/uL (ref 0.2–0.9)
MONOS PCT: 5 %
NEUTROS ABS: 6 10*3/uL (ref 1.4–6.5)
Neutrophils Relative %: 76 %
Platelets: 268 10*3/uL (ref 150–440)
RBC: 3.87 MIL/uL (ref 3.80–5.20)
RDW: 14.6 % — AB (ref 11.5–14.5)
WBC: 7.9 10*3/uL (ref 3.6–11.0)

## 2015-01-14 MED ORDER — MEGESTROL ACETATE 40 MG PO TABS: 40.0000 mg | ORAL_TABLET | Freq: Four times a day (QID) | ORAL | Status: DC

## 2015-01-14 MED ORDER — HYDROCODONE-ACETAMINOPHEN 10-325 MG PO TABS: 1.0000 | ORAL_TABLET | Freq: Four times a day (QID) | ORAL | Status: DC | PRN

## 2015-01-14 NOTE — Progress Notes (Signed)
[place] @ [facility] [phone]  [phone]     Zoe [name] OB: Jun 29, 1960  MR#: [id number]  OHY#:[id number]  Patient Care Team: Sharyne Peach, MD as PCP - General (Family Medicine)  CHIEF COMPLAINT:  Chief Complaint  Patient presents with  . OTHER    Oncology History   Chief Complaint/Diagnosis:   1. Endometroid adenocarcinoma, stage IIIC, grade 3, TAHBSO and complete staging. 2. Carboplatin and paclitaxel x 3.  XRT, treatments delayed due to multiple factors. 3. CT 03/20/14 Findings are compatible with widespread metastatic disease.Specifically, there appears to be local recurrence of disease in the low anatomic pelvis with and ill-defined amorphous soft tissue mass extending from the right side of the vaginal apex to the pelvic sidewall, causing some mild obstruction at the junction of distal and middle third of the right ureter (with mild proximal right hydroureteronephrosis), widespread omental and intraperitoneal metastases, retroperitoneal lymphadenopathy and new pulmonary nodules, as detailed above. 4. February 2016, Biopsy of abdominal mass is positive for malignancy consistent with endometrial cancer. 5. Started Carboplatin AUC 5 and Taxol 168m/m2, 04/17/2014.     [facility] [facility] [place] adnexa ([facility] [place]   07/10/2014 Initial Diagnosis Cancer of uterine adnexa    [place] [place]   03/09/2014 Initial Diagnosis Cancer of uterus    Oncology Flowsheet 07/10/2014 07/24/2014 07/31/2014  Day, Cycle Day 1, 4 - Day 1, 6  CARBOplatin (PARAPLATIN) IV 680 mg - 560 mg  dexamethasone (DECADRON) IV [ 12 mg ] - [ 12 mg ]  fosaprepitant (EMEND) IV [ 150 mg ] - [ 150 mg ]  LORazepam (ATIVAN) IV 1 mg 1 mg 1 mg  PACLitaxel (TAXOL) IV 200 mg/m2 - 175 mg/m2  palonosetron (ALOXI) IV 0.25 mg - 0.25 mg    INTERVAL HISTORY: 54year old lady came today further follow-up regarding her recurrent endometrial cancer.  Patient does not have any chills fever appetite has been  fairly good.  No nausea no vomiting diarrhea.  Most of the upper extremity cramps has resolved.  The patient was advised to take Slow-Mag 3 times a day. Patient continues to have bony pain generalized pain. Patient has been started on letrozole which will be discontinued.  Patient desires to look at the previous CT scan worried about tumor markers.     REVIEW OF SYSTEMS:   GENERAL:  Feels good.  Active.  No fevers, sweats or weight loss. PERFORMANCE STATUS (ECOG):  01 HEENT:  No visual changes, runny nose, sore throat, mouth sores or tenderness. Lungs: No shortness of breath or cough.  No hemoptysis. Cardiac:  No chest pain, palpitations, orthopnea, or PND. GI:  No nausea, vomiting, diarrhea, constipation, melena or hematochezia. GU:  No urgency, frequency, dysuria, or hematuria. Musculoskeletal:  No back pain.  No joint pain.  No muscle tenderness. Extremities:  No pain or swelling. Skin:  No rashes or skin changes. Neuro:  No headache, numbness or weakness, balance or coordination issues. Endocrine:  No diabetes, thyroid issues, hot flashes or night sweats. Psych:  No mood changes, depression or anxiety. Pain:  No focal pain. Review of systems:  All other systems reviewed and found to be negative.  As per HPI. Otherwise, a complete review of systems is negatve.     Additional Past Medical and Surgical History: Past Medical History/Past Surgical History -  Diabetes mellitus  Chronic obstructive pulmonary disease  Allergic rhinitis  Depression  Chronic smoker  Chronic leukocytosis, thrombocytosis, erythrocytosis  Proteinuria, on Accupril started in 2003  Venous  stasis  Peripheral neuropathy  Stomach ulcer 1980 with GI bleed  Arthritis right shoulder  Cholecystectomy  Anxiety/depression  Cataracts    Family History - remarkable for diabetes, hyperlipidemia, hypertension, heart disease, bladder cancer and leukemia.    Social History - chronic smoker 45-pack-years, ongoing.   Denies alcohol or recreational drug usage.  Physically active and ambulatory.      ADVANCED DIRECTIVES: Patient does not have any advanced healthcare directive. Information has been given.   HEALTH MAINTENANCE: Social History  Substance Use Topics  . Smoking status: Current Every Day Smoker  . Smokeless tobacco: None  . Alcohol Use: None     Allergies  Allergen Reactions  . Aspirin     Other reaction(s): Unknown  . Codeine     Other reaction(s): Unknown    Current Outpatient Prescriptions  Medication Sig Dispense Refill  . ADVAIR DISKUS 250-50 MCG/DOSE AEPB Inhale 1 puff into the lungs every morning.   1  . B-D ULTRAFINE III SHORT PEN 31G X 8 MM MISC   0  . Calcium Carbonate-Vitamin D (CALTRATE 600+D) 600-400 MG-UNIT per tablet Take 1 tablet by mouth 2 (two) times daily.    . cyclobenzaprine (FLEXERIL) 5 MG tablet Take 1 tablet (5 mg total) by mouth 3 (three) times daily as needed for muscle spasms. 60 tablet 3  . DULoxetine (CYMBALTA) 30 MG capsule Take by mouth.    . furosemide (LASIX) 20 MG tablet Take 20 mg by mouth. take 2 tablets by mouth once daily    . gabapentin (NEURONTIN) 300 MG capsule 300 mg 4 (four) times daily.   1  . glipiZIDE (GLUCOTROL XL) 5 MG 24 hr tablet Take 5 mg by mouth daily with breakfast.   1  . HYDROcodone-acetaminophen (NORCO) 10-325 MG tablet Take 1 tablet by mouth every 6 (six) hours as needed. 90 tablet 0  . LANTUS SOLOSTAR 100 UNIT/ML Solostar Pen   1  . lisinopril (PRINIVIL,ZESTRIL) 10 MG tablet Take 10 mg by mouth daily.   1  . Magnesium 250 MG TABS Take 2 tablets (500 mg total) by mouth daily.  0  . metFORMIN (GLUCOPHAGE) 1000 MG tablet Take 1,000 mg by mouth 2 (two) times daily with a meal.   0  . montelukast (SINGULAIR) 10 MG tablet Take 10 mg by mouth every evening.  0  . omeprazole (PRILOSEC) 20 MG capsule take 1 capsule by mouth once daily 30 capsule 3  . ondansetron (ZOFRAN) 4 MG tablet Take 4 mg by mouth every 8 (eight) hours as  needed for nausea, vomiting or refractory nausea / vomiting.   0  . PROAIR HFA 108 (90 BASE) MCG/ACT inhaler Inhale 4 puffs into the lungs as needed.   1  . simvastatin (ZOCOR) 20 MG tablet Take 20 mg by mouth daily at 6 PM.     . megestrol (MEGACE) 40 MG tablet Take 1 tablet (40 mg total) by mouth 4 (four) times daily. 120 tablet 3  . traZODone (DESYREL) 50 MG tablet Take 50 mg by mouth at bedtime.   0   No current facility-administered medications for this visit.    OBJECTIVE:  Filed Vitals:   01/14/15 1145  BP: 115/66  Pulse: 78  Temp: 96.1 F (35.6 C)     Body mass index is 35.23 kg/(m^2).    ECOG FS:1 - Symptomatic but completely ambulatory  PHYSICAL EXAM: General status: Performance status is good.  Patient has not lost significant weight HEENT: No evidence of stomatitis.  And alopecia Sclera and conjunctivae :: No jaundice.   pale looking . Lungs: Air  entry equal on both sides.  No rhonchi.  No rales.  Cardiac: Heart sounds are normal.  No pericardial rub.  No murmur. Lymphatic system: Cervical, axillary, inguinal, lymph nodes not palpable GI: Abdomen is soft.  No ascites.  Liver spleen not palpable.  No tenderness.  Bowel sounds are within normal limit Lower extremity: No edema Neurological system: Higher functions, cranial nerves intact.  Peripheral neuropathy  No evidence of peripheral neuropathy. Skin: No rash.  No ecchymosis.[facility]iatric system : Patient's mood has improved.   LAB RESULTS:  Appointment on 01/14/2015  Component Date Value Ref Range Status  . WBC 01/14/2015 7.9  3.6 - 11.0 K/uL Final  . RBC 01/14/2015 3.87  3.80 - 5.20 MIL/uL Final  . Hemoglobin 01/14/2015 12.1  12.0 - 16.0 g/dL Final  . HCT 01/14/2015 35.9  35.0 - 47.0 % Final  . MCV 01/14/2015 92.6  80.0 - 100.0 fL Final  . MCH 01/14/2015 31.2  26.0 - 34.0 pg Final  . MCHC 01/14/2015 33.7  32.0 - 36.0 g/dL Final  . RDW 01/14/2015 14.6* 11.5 - 14.5 % Final  . Platelets 01/14/2015 268  150 -  440 K/uL Final  . Neutrophils Relative % 01/14/2015 76   Final  . Neutro Abs 01/14/2015 6.0  1.4 - 6.5 K/uL Final  . Lymphocytes Relative 01/14/2015 17   Final  . Lymphs Abs 01/14/2015 1.4  1.0 - 3.6 K/uL Final  . Monocytes Relative 01/14/2015 5   Final  . Monocytes Absolute 01/14/2015 0.4  0.2 - 0.9 K/uL Final  . Eosinophils Relative 01/14/2015 1   Final  . Eosinophils Absolute 01/14/2015 0.0  0 - 0.7 K/uL Final  . Basophils Relative 01/14/2015 1   Final  . Basophils Absolute 01/14/2015 0.1  0 - 0.1 K/uL Final  . Sodium 01/14/2015 132* 135 - 145 mmol/L Final  . Potassium 01/14/2015 4.5  3.5 - 5.1 mmol/L Final  . Chloride 01/14/2015 96* 101 - 111 mmol/L Final  . CO2 01/14/2015 28  22 - 32 mmol/L Final  . Glucose, Bld 01/14/2015 162* 65 - 99 mg/dL Final  . BUN 01/14/2015 30* 6 - 20 mg/dL Final  . Creatinine, Ser 01/14/2015 0.67  0.44 - 1.00 mg/dL Final  . Calcium 01/14/2015 9.0  8.9 - 10.3 mg/dL Final  . Total Protein 01/14/2015 7.3  6.5 - 8.1 g/dL Final  . Albumin 01/14/2015 3.3* 3.5 - 5.0 g/dL Final  . AST 01/14/2015 14* 15 - 41 U/L Final  . ALT 01/14/2015 8* 14 - 54 U/L Final  . Alkaline Phosphatase 01/14/2015 104  38 - 126 U/L Final  . Total Bilirubin 01/14/2015 0.3  0.3 - 1.2 mg/dL Final  . GFR calc non Af Amer 01/14/2015 >60  >60 mL/min Final  . GFR calc Af Amer 01/14/2015 >60  >60 mL/min Final   Comment: (NOTE) The eGFR has been calculated using the CKD EPI equation. This calculation has not been validated in all clinical situations. eGFR's persistently <60 mL/min signify possible Chronic Kidney Disease.   . Anion gap 01/14/2015 8  5 - 15 Final    Lab Results  Component Value Date   CA125 25.1 10/11/2014     ASSESSMENT: Stage IV endometrial cancer Diffuse bony pain may be related to letrozole which has been discontinued patient was advised to start Megace after 3 weeks Overall patient has poor tolerance to any medication. As per  patient's request we went over CT  scan of abdomen 1 more time. Multiple omental nodules and lymph nodes and lung nodules have decreased in size  MEDICAL DECISION MAKING:   All lab data has been reviewed. CA-125 during the last evaluation was 25 which will be followed Patient expressed understanding and was in agreement with this plan. She also understands that She can call clinic at any time with any questions, concerns, or complaints.    Cancer of uterus   Staging form: Corpus Uteri - Carcinoma, AJCC 7th Edition     Clinical: Stage IVB (T3b, N0, M1) - Unsigned   Forest Gleason, MD   01/14/2015 2:00 PM

## 2015-01-14 NOTE — Progress Notes (Signed)
Patient complains of generalized pain 10/10.  Having weakness in left leg.  Wants to know what MD recommends about her port.  Has not been flushed in 3 months.  Also states she is having pain in her left shoulder and port area.  Further complains of having nausea and vomiting every morning that starts as soon as she sits up in the bed.

## 2015-01-15 ENCOUNTER — Ambulatory Visit: Payer: Medicaid Other | Admitting: Oncology

## 2015-01-15 ENCOUNTER — Other Ambulatory Visit: Payer: Medicaid Other

## 2015-01-15 LAB — CA 125: CA 125: 35.9 U/mL (ref 0.0–38.1)

## 2015-01-18 ENCOUNTER — Inpatient Hospital Stay: Payer: Medicaid Other

## 2015-01-28 ENCOUNTER — Other Ambulatory Visit: Payer: Self-pay | Admitting: Oncology

## 2015-02-28 ENCOUNTER — Other Ambulatory Visit: Payer: Self-pay | Admitting: Oncology

## 2015-03-06 ENCOUNTER — Ambulatory Visit: Payer: Medicaid Other | Admitting: Oncology

## 2015-03-06 ENCOUNTER — Other Ambulatory Visit: Payer: Medicaid Other

## 2015-03-12 ENCOUNTER — Inpatient Hospital Stay: Payer: Medicaid Other

## 2015-03-12 ENCOUNTER — Inpatient Hospital Stay: Payer: Medicaid Other | Admitting: Oncology

## 2015-03-26 ENCOUNTER — Encounter: Payer: Self-pay | Admitting: Oncology

## 2015-03-26 ENCOUNTER — Inpatient Hospital Stay: Payer: Medicaid Other | Attending: Oncology | Admitting: Oncology

## 2015-03-26 ENCOUNTER — Inpatient Hospital Stay: Payer: Medicaid Other

## 2015-03-26 VITALS — BP 127/80 | HR 94 | Temp 97.7°F | Resp 18 | Wt 200.3 lb

## 2015-03-26 DIAGNOSIS — J449 Chronic obstructive pulmonary disease, unspecified: Secondary | ICD-10-CM | POA: Diagnosis not present

## 2015-03-26 DIAGNOSIS — Z90722 Acquired absence of ovaries, bilateral: Secondary | ICD-10-CM | POA: Insufficient documentation

## 2015-03-26 DIAGNOSIS — F17210 Nicotine dependence, cigarettes, uncomplicated: Secondary | ICD-10-CM | POA: Diagnosis not present

## 2015-03-26 DIAGNOSIS — M129 Arthropathy, unspecified: Secondary | ICD-10-CM | POA: Diagnosis not present

## 2015-03-26 DIAGNOSIS — R202 Paresthesia of skin: Secondary | ICD-10-CM

## 2015-03-26 DIAGNOSIS — Z7984 Long term (current) use of oral hypoglycemic drugs: Secondary | ICD-10-CM | POA: Insufficient documentation

## 2015-03-26 DIAGNOSIS — C55 Malignant neoplasm of uterus, part unspecified: Secondary | ICD-10-CM

## 2015-03-26 DIAGNOSIS — D72829 Elevated white blood cell count, unspecified: Secondary | ICD-10-CM | POA: Insufficient documentation

## 2015-03-26 DIAGNOSIS — Z7982 Long term (current) use of aspirin: Secondary | ICD-10-CM

## 2015-03-26 DIAGNOSIS — R531 Weakness: Secondary | ICD-10-CM | POA: Diagnosis not present

## 2015-03-26 DIAGNOSIS — I878 Other specified disorders of veins: Secondary | ICD-10-CM | POA: Insufficient documentation

## 2015-03-26 DIAGNOSIS — E1165 Type 2 diabetes mellitus with hyperglycemia: Secondary | ICD-10-CM | POA: Insufficient documentation

## 2015-03-26 DIAGNOSIS — E871 Hypo-osmolality and hyponatremia: Secondary | ICD-10-CM | POA: Diagnosis not present

## 2015-03-26 DIAGNOSIS — F419 Anxiety disorder, unspecified: Secondary | ICD-10-CM | POA: Diagnosis not present

## 2015-03-26 DIAGNOSIS — M898X9 Other specified disorders of bone, unspecified site: Secondary | ICD-10-CM | POA: Diagnosis not present

## 2015-03-26 DIAGNOSIS — R200 Anesthesia of skin: Secondary | ICD-10-CM | POA: Insufficient documentation

## 2015-03-26 DIAGNOSIS — F329 Major depressive disorder, single episode, unspecified: Secondary | ICD-10-CM | POA: Diagnosis not present

## 2015-03-26 DIAGNOSIS — Z794 Long term (current) use of insulin: Secondary | ICD-10-CM | POA: Diagnosis not present

## 2015-03-26 DIAGNOSIS — R809 Proteinuria, unspecified: Secondary | ICD-10-CM | POA: Insufficient documentation

## 2015-03-26 DIAGNOSIS — R109 Unspecified abdominal pain: Secondary | ICD-10-CM | POA: Diagnosis not present

## 2015-03-26 DIAGNOSIS — C786 Secondary malignant neoplasm of retroperitoneum and peritoneum: Secondary | ICD-10-CM | POA: Diagnosis not present

## 2015-03-26 DIAGNOSIS — Z8052 Family history of malignant neoplasm of bladder: Secondary | ICD-10-CM | POA: Insufficient documentation

## 2015-03-26 DIAGNOSIS — D473 Essential (hemorrhagic) thrombocythemia: Secondary | ICD-10-CM

## 2015-03-26 DIAGNOSIS — Z79899 Other long term (current) drug therapy: Secondary | ICD-10-CM | POA: Diagnosis not present

## 2015-03-26 DIAGNOSIS — Z806 Family history of leukemia: Secondary | ICD-10-CM | POA: Diagnosis not present

## 2015-03-26 DIAGNOSIS — Z95828 Presence of other vascular implants and grafts: Secondary | ICD-10-CM

## 2015-03-26 DIAGNOSIS — R978 Other abnormal tumor markers: Secondary | ICD-10-CM | POA: Insufficient documentation

## 2015-03-26 DIAGNOSIS — Z8719 Personal history of other diseases of the digestive system: Secondary | ICD-10-CM | POA: Diagnosis not present

## 2015-03-26 DIAGNOSIS — D751 Secondary polycythemia: Secondary | ICD-10-CM | POA: Diagnosis not present

## 2015-03-26 DIAGNOSIS — G629 Polyneuropathy, unspecified: Secondary | ICD-10-CM | POA: Insufficient documentation

## 2015-03-26 DIAGNOSIS — Z9221 Personal history of antineoplastic chemotherapy: Secondary | ICD-10-CM | POA: Insufficient documentation

## 2015-03-26 DIAGNOSIS — C541 Malignant neoplasm of endometrium: Secondary | ICD-10-CM | POA: Diagnosis present

## 2015-03-26 DIAGNOSIS — Z90710 Acquired absence of both cervix and uterus: Secondary | ICD-10-CM | POA: Diagnosis not present

## 2015-03-26 LAB — CBC WITH DIFFERENTIAL/PLATELET
BASOS ABS: 0.1 10*3/uL (ref 0–0.1)
Basophils Relative: 1 %
Eosinophils Absolute: 0 10*3/uL (ref 0–0.7)
Eosinophils Relative: 0 %
HEMATOCRIT: 42.1 % (ref 35.0–47.0)
Hemoglobin: 13.9 g/dL (ref 12.0–16.0)
LYMPHS ABS: 1.6 10*3/uL (ref 1.0–3.6)
Lymphocytes Relative: 20 %
MCH: 29.5 pg (ref 26.0–34.0)
MCHC: 33.1 g/dL (ref 32.0–36.0)
MCV: 89.3 fL (ref 80.0–100.0)
Monocytes Absolute: 0.5 10*3/uL (ref 0.2–0.9)
Monocytes Relative: 6 %
NEUTROS PCT: 73 %
Neutro Abs: 5.9 10*3/uL (ref 1.4–6.5)
PLATELETS: 309 10*3/uL (ref 150–440)
RBC: 4.71 MIL/uL (ref 3.80–5.20)
RDW: 15.7 % — AB (ref 11.5–14.5)
WBC: 8 10*3/uL (ref 3.6–11.0)

## 2015-03-26 LAB — COMPREHENSIVE METABOLIC PANEL
ALBUMIN: 3.4 g/dL — AB (ref 3.5–5.0)
ALT: 11 U/L — ABNORMAL LOW (ref 14–54)
ANION GAP: 10 (ref 5–15)
AST: 11 U/L — ABNORMAL LOW (ref 15–41)
Alkaline Phosphatase: 115 U/L (ref 38–126)
BILIRUBIN TOTAL: 0.4 mg/dL (ref 0.3–1.2)
BUN: 27 mg/dL — ABNORMAL HIGH (ref 6–20)
CHLORIDE: 93 mmol/L — AB (ref 101–111)
CO2: 25 mmol/L (ref 22–32)
Calcium: 9.5 mg/dL (ref 8.9–10.3)
Creatinine, Ser: 0.67 mg/dL (ref 0.44–1.00)
GFR calc Af Amer: >60 mL/min (ref >60)
GFR calc non Af Amer: >60 mL/min (ref >60)
Glucose, Bld: 413 mg/dL — ABNORMAL HIGH (ref 65–99)
POTASSIUM: 4.4 mmol/L (ref 3.5–5.1)
Sodium: 128 mmol/L — ABNORMAL LOW (ref 135–145)
Total Protein: 7.6 g/dL (ref 6.5–8.1)

## 2015-03-26 MED ORDER — HEPARIN SOD (PORK) LOCK FLUSH 100 UNIT/ML IV SOLN
500.0000 [IU] | Freq: Once | INTRAVENOUS | Status: AC
  Administered 2015-03-26: 500 [IU] via INTRAVENOUS

## 2015-03-26 MED ORDER — ONDANSETRON HCL 4 MG PO TABS: 4.0000 mg | ORAL_TABLET | Freq: Three times a day (TID) | ORAL | Status: DC | PRN

## 2015-03-26 MED ORDER — SODIUM CHLORIDE 0.9% FLUSH
10.0000 mL | INTRAVENOUS | Status: DC | PRN
  Administered 2015-03-26: 10 mL via INTRAVENOUS
  Filled 2015-03-26: qty 10

## 2015-03-26 MED ORDER — OXYCODONE HCL 10 MG PO TABS: 10.0000 mg | ORAL_TABLET | Freq: Three times a day (TID) | ORAL | Status: DC | PRN

## 2015-03-26 NOTE — Progress Notes (Signed)
Patient states she fell on New Years Eve, twisting her left leg.  She states she is weak. Further states she is off balance and has to walk with walker in her home.  Also c/o nausea first thing in the morning.  Wants to discuss having port removed.

## 2015-03-26 NOTE — Progress Notes (Signed)
[place] @ [facility] [phone]  Fax:(336) [phone]     Zoe [name] OB: [birth year]  MR#: [id number]  [id number]  Patient Care Team: Sharyne Peach, MD as PCP - General (Family Medicine)  CHIEF COMPLAINT:  No chief complaint on file.   Oncology History   Chief Complaint/Diagnosis:   1. Endometroid adenocarcinoma, stage IIIC, grade 3, TAHBSO and complete staging. 2. Carboplatin and paclitaxel x 3.  XRT, treatments delayed due to multiple factors. 3. CT 03/20/14 Findings are compatible with widespread metastatic disease.Specifically, there appears to be local recurrence of disease in the low anatomic pelvis with and ill-defined amorphous soft tissue mass extending from the right side of the vaginal apex to the pelvic sidewall, causing some mild obstruction at the junction of distal and middle third of the right ureter (with mild proximal right hydroureteronephrosis), widespread omental and intraperitoneal metastases, retroperitoneal lymphadenopathy and new pulmonary nodules, as detailed above. 4. February 2016, Biopsy of abdominal mass is positive for malignancy consistent with endometrial cancer. 5. Started Carboplatin AUC 5 and Taxol 175mg /m2, 04/17/2014.     Cancer of uterine adnexa ([place])   07/10/2014 Initial Diagnosis Cancer of uterine adnexa    Cancer of uterus ([place])   03/09/2014 Initial Diagnosis Cancer of uterus    Oncology Flowsheet 07/10/2014 07/24/2014 07/31/2014  Day, Cycle Day 1, 4 - Day 1, 6  CARBOplatin (PARAPLATIN) IV 680 mg - 560 mg  dexamethasone (DECADRON) IV [ 12 mg ] - [ 12 mg ]  fosaprepitant (EMEND) IV [ 150 mg ] - [ 150 mg ]  LORazepam (ATIVAN) IV 1 mg 1 mg 1 mg  PACLitaxel (TAXOL) IV 200 mg/m2 - 175 mg/m2  palonosetron (ALOXI) IV 0.25 mg - 0.25 mg    INTERVAL HISTORY: 55 year old lady came today further follow-up regarding her recurrent endometrial cancer.  Patient is here for ongoing evaluation and treatment consideration.  Has persistent  weakness in both lower extremity failed during the New Year's Eve.  Abdominal cramps.  No nausea no vomiting.  Tingling numbness and lower extremity persists.  Patient is rising tumor marker Continues to be very depressed.    REVIEW OF SYSTEMS:   GENERAL:  Feels good.  Active.  No fevers, sweats or weight loss. PERFORMANCE STATUS (ECOG):  01 HEENT:  No visual changes, runny nose, sore throat, mouth sores or tenderness. Lungs: No shortness of breath or cough.  No hemoptysis. Cardiac:  No chest pain, palpitations, orthopnea, or PND. GI:  No nausea, vomiting, diarrhea, constipation, melena or hematochezia. GU:  No urgency, frequency, dysuria, or hematuria. Musculoskeletal:  No back pain.  No joint pain.  No muscle tenderness. Extremities:  No pain or swelling. Skin:  No rashes or skin changes. Neuro:  No headache, numbness or weakness, balance or coordination issues. Endocrine:  No diabetes, thyroid issues, hot flashes or night sweats. Psych:  No mood changes, depression or anxiety. Pain:  No focal pain. Review of systems:  All other systems reviewed and found to be negative.  As per HPI. Otherwise, a complete review of systems is negatve.     Additional Past Medical and Surgical History: Past Medical History/Past Surgical History -  Diabetes mellitus  Chronic obstructive pulmonary disease  Allergic rhinitis  Depression  Chronic smoker  Chronic leukocytosis, thrombocytosis, erythrocytosis  Proteinuria, on Accupril started in 2003  Venous stasis  Peripheral neuropathy  Stomach ulcer 1980 with GI bleed  Arthritis right shoulder  Cholecystectomy  Anxiety/depression  Cataracts    Family History - remarkable  for diabetes, hyperlipidemia, hypertension, heart disease, bladder cancer and leukemia.    Social History - chronic smoker 45-pack-years, ongoing.  Denies alcohol or recreational drug usage.  Physically active and ambulatory.      ADVANCED DIRECTIVES: Patient does not have  any advanced healthcare directive. Information has been given.   HEALTH MAINTENANCE: Social History  Substance Use Topics  . Smoking status: Current Every Day Smoker  . Smokeless tobacco: None  . Alcohol Use: None     Allergies  Allergen Reactions  . Aspirin     Other reaction(s): Unknown  . Codeine     Other reaction(s): Unknown    Current Outpatient Prescriptions  Medication Sig Dispense Refill  . ADVAIR DISKUS 250-50 MCG/DOSE AEPB Inhale 1 puff into the lungs every morning.   1  . B-D ULTRAFINE III SHORT PEN 31G X 8 MM MISC   0  . Calcium Carbonate-Vitamin D (CALTRATE 600+D) 600-400 MG-UNIT per tablet Take 1 tablet by mouth 2 (two) times daily.    . cyclobenzaprine (FLEXERIL) 5 MG tablet Take 1 tablet (5 mg total) by mouth 3 (three) times daily as needed for muscle spasms. 60 tablet 3  . DULoxetine (CYMBALTA) 30 MG capsule Take by mouth.    . furosemide (LASIX) 20 MG tablet Take 20 mg by mouth. take 2 tablets by mouth once daily    . gabapentin (NEURONTIN) 300 MG capsule 300 mg 4 (four) times daily.   1  . glipiZIDE (GLUCOTROL XL) 5 MG 24 hr tablet Take 5 mg by mouth daily with breakfast.   1  . HYDROcodone-acetaminophen (NORCO) 10-325 MG tablet Take 1 tablet by mouth every 6 (six) hours as needed. 90 tablet 0  . LANTUS SOLOSTAR 100 UNIT/ML Solostar Pen   1  . lisinopril (PRINIVIL,ZESTRIL) 10 MG tablet Take 10 mg by mouth daily.   1  . Magnesium 250 MG TABS Take 2 tablets (500 mg total) by mouth daily.  0  . megestrol (MEGACE) 40 MG tablet Take 1 tablet (40 mg total) by mouth 4 (four) times daily. 120 tablet 3  . metFORMIN (GLUCOPHAGE) 1000 MG tablet Take 1,000 mg by mouth 2 (two) times daily with a meal.   0  . montelukast (SINGULAIR) 10 MG tablet take 1 tablet by mouth once daily every evening 30 tablet 3  . omeprazole (PRILOSEC) 20 MG capsule take 1 capsule by mouth once daily 30 capsule 3  . ondansetron (ZOFRAN) 4 MG tablet Take 4 mg by mouth every 8 (eight) hours as  needed for nausea, vomiting or refractory nausea / vomiting.   0  . PROAIR HFA 108 (90 BASE) MCG/ACT inhaler Inhale 4 puffs into the lungs as needed.   1  . traZODone (DESYREL) 50 MG tablet Take 50 mg by mouth at bedtime.   0  . simvastatin (ZOCOR) 20 MG tablet Take 20 mg by mouth daily at 6 PM.      No current facility-administered medications for this visit.    OBJECTIVE:  Filed Vitals:   03/26/15 1343  BP: 127/80  Pulse: 94  Temp: 97.7 F (36.5 C)  Resp: 18     Body mass index is 34.58 kg/(m^2).    ECOG FS:1 - Symptomatic but completely ambulatory  PHYSICAL EXAM: General status: Performance status is good.  Patient has not lost significant weight HEENT: No evidence of stomatitis.  And alopecia Sclera and conjunctivae :: No jaundice.   pale looking . Lungs: Air  entry equal on both sides.  No rhonchi.  No rales.  Cardiac: Heart sounds are normal.  No pericardial rub.  No murmur. Lymphatic system: Cervical, axillary, inguinal, lymph nodes not palpable GI: Abdomen is soft.  No ascites.  Liver spleen not palpable.  No tenderness.  Bowel sounds are within normal limit Lower extremity: No edema Neurological system: Higher functions, cranial nerves intact.  Peripheral neuropathy  No evidence of peripheral neuropathy. Skin: No rash.  No ecchymosis.[facility] Psychiatric system : Patient's mood has improved.  Marland Kitchen  .Sugar is extremely high.  Sodium is low. I discussed with patient that she needs to contact primary care physician regarding diabetes.  Possibility of running any PET scan would be considered to evaluate for progression of disease. LAB RESULTS:  Appointment on 03/26/2015  Component Date Value Ref Range Status  . WBC 03/26/2015 8.0  3.6 - 11.0 K/uL Final  . RBC 03/26/2015 4.71  3.80 - 5.20 MIL/uL Final  . Hemoglobin 03/26/2015 13.9  12.0 - 16.0 g/dL Final  . HCT 03/26/2015 42.1  35.0 - 47.0 % Final  . MCV 03/26/2015 89.3  80.0 - 100.0 fL Final  . MCH 03/26/2015 29.5  26.0 -  34.0 pg Final  . MCHC 03/26/2015 33.1  32.0 - 36.0 g/dL Final  . RDW 03/26/2015 15.7* 11.5 - 14.5 % Final  . Platelets 03/26/2015 309  150 - 440 K/uL Final  . Neutrophils Relative % 03/26/2015 73   Final  . Neutro Abs 03/26/2015 5.9  1.4 - 6.5 K/uL Final  . Lymphocytes Relative 03/26/2015 20   Final  . Lymphs Abs 03/26/2015 1.6  1.0 - 3.6 K/uL Final  . Monocytes Relative 03/26/2015 6   Final  . Monocytes Absolute 03/26/2015 0.5  0.2 - 0.9 K/uL Final  . Eosinophils Relative 03/26/2015 0   Final  . Eosinophils Absolute 03/26/2015 0.0  0 - 0.7 K/uL Final  . Basophils Relative 03/26/2015 1   Final  . Basophils Absolute 03/26/2015 0.1  0 - 0.1 K/uL Final    Lab Results  Component Value Date   CA125 35.9 01/14/2015     ASSESSMENT: Stage IV endometrial cancer Diffuse bony pain may be related to letrozole which has been discontinued patient was advised to start Megace after 3 weeks Overall patient has poor tolerance to any medication. As per patient's request we went over CT scan of abdomen 1 more time. Multiple omental nodules and lymph nodes and lung nodules have decreased in size CM 125   76.1  MEDICAL DECISION MAKING:  All lab data has been reviewed Hyperglycemia and hyponatremia Patient will be instructed to contact primary care physician Rising tumor markers.  Repeat CT scan Overall prognosis is been poor and that has been discussed Patient did not take any Megace because of swelling.  Patient will be encouraged to start Megace Duration of visit is 25 minutes and 50% of time was spent discussing VARIOUS  options or an prognosis Patient expressed understanding and was in agreement with this plan. She also understands that She can call clinic at any time with any questions, concerns, or complaints.    Cancer of uterus   Staging form: Corpus Uteri - Carcinoma, AJCC 7th Edition     Clinical: Stage IVB (T3b, N0, M1) - Unsigned   Forest Gleason, MD   03/26/2015 1:56 PM

## 2015-03-27 ENCOUNTER — Telehealth: Payer: Self-pay | Admitting: *Deleted

## 2015-03-27 ENCOUNTER — Encounter: Payer: Self-pay | Admitting: Oncology

## 2015-03-27 LAB — CA 125: CA 125: 76.1 U/mL — ABNORMAL HIGH (ref 0.0–38.1)

## 2015-03-27 NOTE — Telephone Encounter (Signed)
Pt has elevated blood glucose and needs to follow up with PCP for further evaluation and diabetes management.

## 2015-03-27 NOTE — Telephone Encounter (Signed)
Called and left message for patient that her glucose is elevated and MD recommends she follow up with her PCP for further evaluation and diabetes management.

## 2015-04-08 ENCOUNTER — Ambulatory Visit
Admission: RE | Admit: 2015-04-08 | Discharge: 2015-04-08 | Disposition: A | Discharge status: Home / Self Care | Payer: Medicaid Other | Source: Ambulatory Visit | Attending: Oncology | Admitting: Oncology

## 2015-04-08 DIAGNOSIS — C55 Malignant neoplasm of uterus, part unspecified: Secondary | ICD-10-CM

## 2015-04-08 DIAGNOSIS — C541 Malignant neoplasm of endometrium: Secondary | ICD-10-CM | POA: Diagnosis present

## 2015-04-08 DIAGNOSIS — R590 Localized enlarged lymph nodes: Secondary | ICD-10-CM | POA: Diagnosis not present

## 2015-04-08 DIAGNOSIS — R1909 Other intra-abdominal and pelvic swelling, mass and lump: Secondary | ICD-10-CM | POA: Diagnosis not present

## 2015-04-08 DIAGNOSIS — R911 Solitary pulmonary nodule: Secondary | ICD-10-CM | POA: Insufficient documentation

## 2015-04-08 MED ORDER — IOHEXOL 300 MG/ML  SOLN
100.0000 mL | Freq: Once | INTRAMUSCULAR | Status: AC | PRN
  Administered 2015-04-08: 100 mL via INTRAVENOUS

## 2015-04-10 ENCOUNTER — Inpatient Hospital Stay (HOSPITAL_BASED_OUTPATIENT_CLINIC_OR_DEPARTMENT_OTHER): Payer: Medicaid Other | Admitting: Oncology

## 2015-04-10 ENCOUNTER — Inpatient Hospital Stay: Payer: Medicaid Other | Attending: Oncology

## 2015-04-10 VITALS — BP 121/77 | HR 82 | Temp 97.0°F | Resp 18 | Wt 206.1 lb

## 2015-04-10 DIAGNOSIS — C786 Secondary malignant neoplasm of retroperitoneum and peritoneum: Secondary | ICD-10-CM | POA: Insufficient documentation

## 2015-04-10 DIAGNOSIS — Z794 Long term (current) use of insulin: Secondary | ICD-10-CM | POA: Insufficient documentation

## 2015-04-10 DIAGNOSIS — M19011 Primary osteoarthritis, right shoulder: Secondary | ICD-10-CM

## 2015-04-10 DIAGNOSIS — R109 Unspecified abdominal pain: Secondary | ICD-10-CM

## 2015-04-10 DIAGNOSIS — R202 Paresthesia of skin: Secondary | ICD-10-CM

## 2015-04-10 DIAGNOSIS — C574 Malignant neoplasm of uterine adnexa, unspecified: Secondary | ICD-10-CM | POA: Diagnosis present

## 2015-04-10 DIAGNOSIS — R978 Other abnormal tumor markers: Secondary | ICD-10-CM | POA: Insufficient documentation

## 2015-04-10 DIAGNOSIS — D751 Secondary polycythemia: Secondary | ICD-10-CM | POA: Insufficient documentation

## 2015-04-10 DIAGNOSIS — R531 Weakness: Secondary | ICD-10-CM | POA: Insufficient documentation

## 2015-04-10 DIAGNOSIS — Z806 Family history of leukemia: Secondary | ICD-10-CM

## 2015-04-10 DIAGNOSIS — E871 Hypo-osmolality and hyponatremia: Secondary | ICD-10-CM | POA: Diagnosis not present

## 2015-04-10 DIAGNOSIS — Z8711 Personal history of peptic ulcer disease: Secondary | ICD-10-CM | POA: Insufficient documentation

## 2015-04-10 DIAGNOSIS — F329 Major depressive disorder, single episode, unspecified: Secondary | ICD-10-CM

## 2015-04-10 DIAGNOSIS — Z79899 Other long term (current) drug therapy: Secondary | ICD-10-CM

## 2015-04-10 DIAGNOSIS — D72829 Elevated white blood cell count, unspecified: Secondary | ICD-10-CM

## 2015-04-10 DIAGNOSIS — Z7984 Long term (current) use of oral hypoglycemic drugs: Secondary | ICD-10-CM

## 2015-04-10 DIAGNOSIS — J449 Chronic obstructive pulmonary disease, unspecified: Secondary | ICD-10-CM

## 2015-04-10 DIAGNOSIS — G629 Polyneuropathy, unspecified: Secondary | ICD-10-CM

## 2015-04-10 DIAGNOSIS — Z8052 Family history of malignant neoplasm of bladder: Secondary | ICD-10-CM | POA: Insufficient documentation

## 2015-04-10 DIAGNOSIS — I878 Other specified disorders of veins: Secondary | ICD-10-CM | POA: Diagnosis not present

## 2015-04-10 DIAGNOSIS — E1165 Type 2 diabetes mellitus with hyperglycemia: Secondary | ICD-10-CM | POA: Insufficient documentation

## 2015-04-10 DIAGNOSIS — D473 Essential (hemorrhagic) thrombocythemia: Secondary | ICD-10-CM | POA: Insufficient documentation

## 2015-04-10 DIAGNOSIS — F17210 Nicotine dependence, cigarettes, uncomplicated: Secondary | ICD-10-CM | POA: Diagnosis not present

## 2015-04-10 DIAGNOSIS — F418 Other specified anxiety disorders: Secondary | ICD-10-CM | POA: Diagnosis not present

## 2015-04-10 DIAGNOSIS — R200 Anesthesia of skin: Secondary | ICD-10-CM | POA: Insufficient documentation

## 2015-04-10 DIAGNOSIS — C55 Malignant neoplasm of uterus, part unspecified: Secondary | ICD-10-CM

## 2015-04-10 LAB — CBC WITH DIFFERENTIAL/PLATELET
BASOS ABS: 0.1 10*3/uL (ref 0–0.1)
BASOS PCT: 1 %
EOS ABS: 0 10*3/uL (ref 0–0.7)
Eosinophils Relative: 1 %
HEMATOCRIT: 35.6 % (ref 35.0–47.0)
Hemoglobin: 12 g/dL (ref 12.0–16.0)
Lymphocytes Relative: 20 %
Lymphs Abs: 1.4 10*3/uL (ref 1.0–3.6)
MCH: 29.8 pg (ref 26.0–34.0)
MCHC: 33.7 g/dL (ref 32.0–36.0)
MCV: 88.4 fL (ref 80.0–100.0)
MONO ABS: 0.4 10*3/uL (ref 0.2–0.9)
Monocytes Relative: 5 %
NEUTROS ABS: 5.2 10*3/uL (ref 1.4–6.5)
NEUTROS PCT: 73 %
Platelets: 282 10*3/uL (ref 150–440)
RBC: 4.02 MIL/uL (ref 3.80–5.20)
RDW: 14.8 % — AB (ref 11.5–14.5)
WBC: 7.1 10*3/uL (ref 3.6–11.0)

## 2015-04-10 LAB — COMPREHENSIVE METABOLIC PANEL
ALBUMIN: 3 g/dL — AB (ref 3.5–5.0)
ALK PHOS: 112 U/L (ref 38–126)
ALT: 9 U/L — ABNORMAL LOW (ref 14–54)
ANION GAP: 8 (ref 5–15)
AST: 11 U/L — ABNORMAL LOW (ref 15–41)
BILIRUBIN TOTAL: 0.2 mg/dL — AB (ref 0.3–1.2)
BUN: 17 mg/dL (ref 6–20)
CALCIUM: 8.9 mg/dL (ref 8.9–10.3)
CO2: 28 mmol/L (ref 22–32)
CREATININE: 0.61 mg/dL (ref 0.44–1.00)
Chloride: 101 mmol/L (ref 101–111)
GFR calc non Af Amer: >60 mL/min (ref >60)
GLUCOSE: 338 mg/dL — AB (ref 65–99)
Potassium: 3.9 mmol/L (ref 3.5–5.1)
SODIUM: 137 mmol/L (ref 135–145)
TOTAL PROTEIN: 6.8 g/dL (ref 6.5–8.1)

## 2015-04-10 MED ORDER — MEGESTROL ACETATE 40 MG PO TABS: 80.0000 mg | ORAL_TABLET | Freq: Two times a day (BID) | ORAL | Status: DC

## 2015-04-10 MED ORDER — OXYCODONE HCL 10 MG PO TABS: 10.0000 mg | ORAL_TABLET | Freq: Three times a day (TID) | ORAL | Status: DC | PRN

## 2015-04-10 NOTE — Progress Notes (Signed)
Patient here for CT results.  

## 2015-04-11 LAB — CA 125: CA 125: 57.7 U/mL — AB (ref 0.0–38.1)

## 2015-04-14 ENCOUNTER — Encounter: Payer: Self-pay | Admitting: Oncology

## 2015-04-14 NOTE — Progress Notes (Signed)
[place] @ [facility] Telephone:([phone]) [phone]  [phone]     NEYSA ARTS OB: 1960/03/23  MR#: [id number]  PPI#:[id number]  Patient Care Team: Sharyne Peach, MD as PCP - General (Family Medicine)  CHIEF COMPLAINT:  Chief Complaint  Patient presents with  . Uterine Cancer    Oncology History   Chief Complaint/Diagnosis:   1. Endometroid adenocarcinoma, stage IIIC, grade 3, TAHBSO and complete staging. 2. Carboplatin and paclitaxel x 3.  XRT, treatments delayed due to multiple factors. 3. CT 03/20/14 Findings are compatible with widespread metastatic disease.Specifically, there appears to be local recurrence of disease in the low anatomic pelvis with and ill-defined amorphous soft tissue mass extending from the right side of the vaginal apex to the pelvic sidewall, causing some mild obstruction at the junction of distal and middle third of the right ureter (with mild proximal right hydroureteronephrosis), widespread omental and intraperitoneal metastases, retroperitoneal lymphadenopathy and new pulmonary nodules, as detailed above. 4. February 2016, Biopsy of abdominal mass is positive for malignancy consistent with endometrial cancer. 5. Started Carboplatin AUC 5 and Taxol 127m/m2, 04/17/2014.     Cancer of uterine adnexa ([facility]   07/10/2014 Initial Diagnosis Cancer of uterine adnexa    Cancer of uterus ([facility]   03/09/2014 Initial Diagnosis Cancer of uterus    Oncology Flowsheet 07/10/2014 07/24/2014 07/31/2014  Day, Cycle Day 1, 4 - Day 1, 6  CARBOplatin (PARAPLATIN) IV 680 mg - 560 mg  dexamethasone (DECADRON) IV [ 12 mg ] - [ 12 mg ]  fosaprepitant (EMEND) IV [ 150 mg ] - [ 150 mg ]  LORazepam (ATIVAN) IV 1 mg 1 mg 1 mg  PACLitaxel (TAXOL) IV 200 mg/m2 - 175 mg/m2  palonosetron (ALOXI) IV 0.25 mg - 0.25 mg    INTERVAL HISTORY: 55year old lady came today further follow-up regarding her recurrent endometrial cancer.  Patient is here for ongoing evaluation and treatment  consideration.  Has persistent weakness in both lower extremity failed during the New Year's Eve.  Abdominal cramps.  No nausea no vomiting.  Tingling numbness and lower extremity persists.  Patient is rising tumor marker Continues to be very depressed. Patient had a CT scan.  Here to discuss the result patient also has progressing and rising tumor markers.  Patient does not want any further chemotherapy.  Patient did not like Megace   REVIEW OF SYSTEMS:   GENERAL:  Feels good.  Active.  No fevers, sweats or weight loss. PERFORMANCE STATUS (ECOG):  01 HEENT:  No visual changes, runny nose, sore throat, mouth sores or tenderness. Lungs: No shortness of breath or cough.  No hemoptysis. Cardiac:  No chest pain, palpitations, orthopnea, or PND. GI:  No nausea, vomiting, diarrhea, constipation, melena or hematochezia. GU:  No urgency, frequency, dysuria, or hematuria. Musculoskeletal:  No back pain.  No joint pain.  No muscle tenderness. Extremities:  No pain or swelling. Skin:  No rashes or skin changes. Neuro:  No headache, numbness or weakness, balance or coordination issues. Endocrine:  No diabetes, thyroid issues, hot flashes or night sweats. Psych:  No mood changes, depression or anxiety. Pain:  No focal pain. Review of systems:  All other systems reviewed and found to be negative.  As per HPI. Otherwise, a complete review of systems is negatve.     Additional Past Medical and Surgical History: Past Medical History/Past Surgical History -  Diabetes mellitus  Chronic obstructive pulmonary disease  Allergic rhinitis  Depression  Chronic smoker  Chronic leukocytosis, thrombocytosis,  erythrocytosis  Proteinuria, on Accupril started in 2003  Venous stasis  Peripheral neuropathy  Stomach ulcer 1980 with GI bleed  Arthritis right shoulder  Cholecystectomy  Anxiety/depression  Cataracts    Family History - remarkable for diabetes, hyperlipidemia, hypertension, heart disease,  bladder cancer and leukemia.    Social History - chronic smoker 45-pack-years, ongoing.  Denies alcohol or recreational drug usage.  Physically active and ambulatory.      ADVANCED DIRECTIVES: Patient does not have any advanced healthcare directive. Information has been given.   HEALTH MAINTENANCE: Social History  Substance Use Topics  . Smoking status: Current Every Day Smoker  . Smokeless tobacco: None  . Alcohol Use: None     Allergies  Allergen Reactions  . Aspirin     Other reaction(s): Unknown  . Codeine     Other reaction(s): Unknown    Current Outpatient Prescriptions  Medication Sig Dispense Refill  . ADVAIR DISKUS 250-50 MCG/DOSE AEPB Inhale 1 puff into the lungs every morning.   1  . B-D ULTRAFINE III SHORT PEN 31G X 8 MM MISC   0  . Calcium Carbonate-Vitamin D (CALTRATE 600+D) 600-400 MG-UNIT per tablet Take 1 tablet by mouth 2 (two) times daily.    . cyclobenzaprine (FLEXERIL) 5 MG tablet Take 1 tablet (5 mg total) by mouth 3 (three) times daily as needed for muscle spasms. 60 tablet 3  . DULoxetine (CYMBALTA) 30 MG capsule Take by mouth.    . furosemide (LASIX) 20 MG tablet Take 20 mg by mouth. take 2 tablets by mouth once daily    . gabapentin (NEURONTIN) 300 MG capsule 300 mg 4 (four) times daily.   1  . glipiZIDE (GLUCOTROL XL) 5 MG 24 hr tablet Take 5 mg by mouth daily with breakfast.   1  . LANTUS SOLOSTAR 100 UNIT/ML Solostar Pen   1  . lisinopril (PRINIVIL,ZESTRIL) 10 MG tablet Take 10 mg by mouth daily.   1  . Magnesium 250 MG TABS Take 2 tablets (500 mg total) by mouth daily.  0  . megestrol (MEGACE) 40 MG tablet Take 2 tablets (80 mg total) by mouth 2 (two) times daily. 120 tablet 3  . metFORMIN (GLUCOPHAGE) 1000 MG tablet Take 1,000 mg by mouth 2 (two) times daily with a meal.   0  . montelukast (SINGULAIR) 10 MG tablet take 1 tablet by mouth once daily every evening 30 tablet 3  . omeprazole (PRILOSEC) 20 MG capsule take 1 capsule by mouth once  daily 30 capsule 3  . ondansetron (ZOFRAN) 4 MG tablet Take 1 tablet (4 mg total) by mouth every 8 (eight) hours as needed for nausea, vomiting or refractory nausea / vomiting. 20 tablet 0  . Oxycodone HCl 10 MG TABS Take 1 tablet (10 mg total) by mouth 3 (three) times daily as needed. 135 tablet 0  . PROAIR HFA 108 (90 BASE) MCG/ACT inhaler Inhale 4 puffs into the lungs as needed.   1  . traZODone (DESYREL) 50 MG tablet Take 50 mg by mouth at bedtime.   0  . simvastatin (ZOCOR) 20 MG tablet Take 20 mg by mouth daily at 6 PM.      No current facility-administered medications for this visit.    OBJECTIVE:  Filed Vitals:   04/10/15 1457  BP: 121/77  Pulse: 82  Temp: 97 F (36.1 C)  Resp: 18     Body mass index is 35.58 kg/(m^2).    ECOG FS:1 - Symptomatic but completely  ambulatory  PHYSICAL EXAM: General status: Performance status is good.  Patient has not lost significant weight HEENT: No evidence of stomatitis.  And alopecia Sclera and conjunctivae :: No jaundice.   pale looking . Lungs: Air  entry equal on both sides.  No rhonchi.  No rales.  Cardiac: Heart sounds are normal.  No pericardial rub.  No murmur. Lymphatic system: Cervical, axillary, inguinal, lymph nodes not palpable GI: Abdomen is soft.  No ascites.  Liver spleen not palpable.  No tenderness.  Bowel sounds are within normal limit Lower extremity: No edema Neurological system: Higher functions, cranial nerves intact.  Peripheral neuropathy  No evidence of peripheral neuropathy. Skin: No rash.  No ecchymosis.Marland Kitchen Psychiatric system : Patient's mood has improved.  Marland Kitchen  .Sugar is extremely high.  Sodium is low. I discussed with patient that she needs to contact primary care physician regarding diabetes.  Possibility of running any PET scan would be considered to evaluate for progression of disease. LAB RESULTS:  Appointment on 04/10/2015  Component Date Value Ref Range Status  . Sodium 04/10/2015 137  135 - 145 mmol/L  Final  . Potassium 04/10/2015 3.9  3.5 - 5.1 mmol/L Final  . Chloride 04/10/2015 101  101 - 111 mmol/L Final  . CO2 04/10/2015 28  22 - 32 mmol/L Final  . Glucose, Bld 04/10/2015 338* 65 - 99 mg/dL Final  . BUN 04/10/2015 17  6 - 20 mg/dL Final  . Creatinine, Ser 04/10/2015 0.61  0.44 - 1.00 mg/dL Final  . Calcium 04/10/2015 8.9  8.9 - 10.3 mg/dL Final  . Total Protein 04/10/2015 6.8  6.5 - 8.1 g/dL Final  . Albumin 04/10/2015 3.0* 3.5 - 5.0 g/dL Final  . AST 04/10/2015 11* 15 - 41 U/L Final  . ALT 04/10/2015 9* 14 - 54 U/L Final  . Alkaline Phosphatase 04/10/2015 112  38 - 126 U/L Final  . Total Bilirubin 04/10/2015 0.2* 0.3 - 1.2 mg/dL Final  . GFR calc non Af Amer 04/10/2015 >60  >60 mL/min Final  . GFR calc Af Amer 04/10/2015 >60  >60 mL/min Final   Comment: (NOTE) The eGFR has been calculated using the CKD EPI equation. This calculation has not been validated in all clinical situations. eGFR's persistently <60 mL/min signify possible Chronic Kidney Disease.   . Anion gap 04/10/2015 8  5 - 15 Final  . WBC 04/10/2015 7.1  3.6 - 11.0 K/uL Final  . RBC 04/10/2015 4.02  3.80 - 5.20 MIL/uL Final  . Hemoglobin 04/10/2015 12.0  12.0 - 16.0 g/dL Final  . HCT 04/10/2015 35.6  35.0 - 47.0 % Final  . MCV 04/10/2015 88.4  80.0 - 100.0 fL Final  . MCH 04/10/2015 29.8  26.0 - 34.0 pg Final  . MCHC 04/10/2015 33.7  32.0 - 36.0 g/dL Final  . RDW 04/10/2015 14.8* 11.5 - 14.5 % Final  . Platelets 04/10/2015 282  150 - 440 K/uL Final  . Neutrophils Relative % 04/10/2015 73   Final  . Neutro Abs 04/10/2015 5.2  1.4 - 6.5 K/uL Final  . Lymphocytes Relative 04/10/2015 20   Final  . Lymphs Abs 04/10/2015 1.4  1.0 - 3.6 K/uL Final  . Monocytes Relative 04/10/2015 5   Final  . Monocytes Absolute 04/10/2015 0.4  0.2 - 0.9 K/uL Final  . Eosinophils Relative 04/10/2015 1   Final  . Eosinophils Absolute 04/10/2015 0.0  0 - 0.7 K/uL Final  . Basophils Relative 04/10/2015 1   Final  .  Basophils  Absolute 04/10/2015 0.1  0 - 0.1 K/uL Final  . CA 125 04/10/2015 57.7* 0.0 - 38.1 U/mL Final   Comment: (NOTE) Roche ECLIA methodology Performed At: [facility] [place], [place] [id number] Lindon Romp MD YN:[id number]     Lab Results  Component Value Date   CA125 57.7* 04/10/2015     ASSESSMENT: Stage IV endometrial cancer Diffuse bony pain may be related to letrozole which has been discontinued patient was advised to start Megace after 3 weeks Overall patient has poor tolerance to any medication. ze CA-125 has dropped to 57.   MEDICAL DECISION MAKING:  All lab data has been reviewed Hyperglycemia being managed by primary care physician Ct  scan has been reviewed independently shows stable disease. I encouraged patient to start Megace Also will made an appointment for her to be seen by GYN oncology is to offer if there is any other options available other than chemotherapy.  Total duration of visit was 30 minutes.  50% or more time was spent in counseling patient and family regarding prognosis and options of treatment and available resources  Cancer of uterus   Staging form: Corpus Uteri - Carcinoma, AJCC 7th Edition     Clinical: Stage IVB (T3b, N0, M1) - Unsigned   Forest Gleason, MD   04/14/2015 9:34 AM

## 2015-04-17 ENCOUNTER — Inpatient Hospital Stay: Payer: Medicaid Other

## 2015-04-26 ENCOUNTER — Other Ambulatory Visit: Payer: Self-pay | Admitting: Oncology

## 2015-05-01 ENCOUNTER — Ambulatory Visit: Payer: Medicaid Other

## 2015-05-22 ENCOUNTER — Inpatient Hospital Stay: Payer: Medicaid Other

## 2015-05-22 ENCOUNTER — Other Ambulatory Visit: Payer: Medicaid Other

## 2015-05-22 ENCOUNTER — Ambulatory Visit: Payer: Medicaid Other | Admitting: Oncology

## 2015-05-24 ENCOUNTER — Inpatient Hospital Stay: Payer: Medicaid Other | Attending: Oncology

## 2015-05-24 ENCOUNTER — Encounter: Payer: Self-pay | Admitting: Emergency Medicine

## 2015-05-24 ENCOUNTER — Emergency Department: Payer: Medicaid Other

## 2015-05-24 ENCOUNTER — Emergency Department
Admission: EM | Admit: 2015-05-24 | Discharge: 2015-05-24 | Disposition: A | Discharge status: Home / Self Care | Payer: Medicaid Other | Attending: Emergency Medicine | Admitting: Emergency Medicine

## 2015-05-24 ENCOUNTER — Inpatient Hospital Stay (HOSPITAL_BASED_OUTPATIENT_CLINIC_OR_DEPARTMENT_OTHER): Payer: Medicaid Other | Admitting: Oncology

## 2015-05-24 VITALS — BP 144/96 | HR 144 | Temp 96.5°F | Resp 20

## 2015-05-24 DIAGNOSIS — R1084 Generalized abdominal pain: Secondary | ICD-10-CM | POA: Diagnosis not present

## 2015-05-24 DIAGNOSIS — E1165 Type 2 diabetes mellitus with hyperglycemia: Secondary | ICD-10-CM | POA: Diagnosis not present

## 2015-05-24 DIAGNOSIS — F17210 Nicotine dependence, cigarettes, uncomplicated: Secondary | ICD-10-CM | POA: Insufficient documentation

## 2015-05-24 DIAGNOSIS — R110 Nausea: Secondary | ICD-10-CM

## 2015-05-24 DIAGNOSIS — Z9221 Personal history of antineoplastic chemotherapy: Secondary | ICD-10-CM

## 2015-05-24 DIAGNOSIS — I10 Essential (primary) hypertension: Secondary | ICD-10-CM | POA: Insufficient documentation

## 2015-05-24 DIAGNOSIS — D473 Essential (hemorrhagic) thrombocythemia: Secondary | ICD-10-CM

## 2015-05-24 DIAGNOSIS — R000 Tachycardia, unspecified: Secondary | ICD-10-CM | POA: Insufficient documentation

## 2015-05-24 DIAGNOSIS — F17200 Nicotine dependence, unspecified, uncomplicated: Secondary | ICD-10-CM | POA: Diagnosis not present

## 2015-05-24 DIAGNOSIS — Z794 Long term (current) use of insulin: Secondary | ICD-10-CM | POA: Insufficient documentation

## 2015-05-24 DIAGNOSIS — C541 Malignant neoplasm of endometrium: Secondary | ICD-10-CM | POA: Insufficient documentation

## 2015-05-24 DIAGNOSIS — Z7984 Long term (current) use of oral hypoglycemic drugs: Secondary | ICD-10-CM

## 2015-05-24 DIAGNOSIS — R531 Weakness: Secondary | ICD-10-CM

## 2015-05-24 DIAGNOSIS — E119 Type 2 diabetes mellitus without complications: Secondary | ICD-10-CM | POA: Insufficient documentation

## 2015-05-24 DIAGNOSIS — R978 Other abnormal tumor markers: Secondary | ICD-10-CM | POA: Diagnosis not present

## 2015-05-24 DIAGNOSIS — Z79899 Other long term (current) drug therapy: Secondary | ICD-10-CM | POA: Insufficient documentation

## 2015-05-24 DIAGNOSIS — M129 Arthropathy, unspecified: Secondary | ICD-10-CM

## 2015-05-24 DIAGNOSIS — R197 Diarrhea, unspecified: Secondary | ICD-10-CM | POA: Insufficient documentation

## 2015-05-24 DIAGNOSIS — R200 Anesthesia of skin: Secondary | ICD-10-CM | POA: Diagnosis not present

## 2015-05-24 DIAGNOSIS — D751 Secondary polycythemia: Secondary | ICD-10-CM | POA: Diagnosis not present

## 2015-05-24 DIAGNOSIS — G629 Polyneuropathy, unspecified: Secondary | ICD-10-CM | POA: Insufficient documentation

## 2015-05-24 DIAGNOSIS — I878 Other specified disorders of veins: Secondary | ICD-10-CM

## 2015-05-24 DIAGNOSIS — C55 Malignant neoplasm of uterus, part unspecified: Secondary | ICD-10-CM

## 2015-05-24 DIAGNOSIS — D72829 Elevated white blood cell count, unspecified: Secondary | ICD-10-CM

## 2015-05-24 DIAGNOSIS — R109 Unspecified abdominal pain: Secondary | ICD-10-CM

## 2015-05-24 DIAGNOSIS — F329 Major depressive disorder, single episode, unspecified: Secondary | ICD-10-CM | POA: Diagnosis not present

## 2015-05-24 DIAGNOSIS — Z923 Personal history of irradiation: Secondary | ICD-10-CM

## 2015-05-24 DIAGNOSIS — R202 Paresthesia of skin: Secondary | ICD-10-CM | POA: Diagnosis not present

## 2015-05-24 DIAGNOSIS — C799 Secondary malignant neoplasm of unspecified site: Secondary | ICD-10-CM | POA: Diagnosis not present

## 2015-05-24 DIAGNOSIS — J449 Chronic obstructive pulmonary disease, unspecified: Secondary | ICD-10-CM | POA: Diagnosis not present

## 2015-05-24 DIAGNOSIS — Z8711 Personal history of peptic ulcer disease: Secondary | ICD-10-CM | POA: Insufficient documentation

## 2015-05-24 LAB — CBC WITH DIFFERENTIAL/PLATELET
BASOS ABS: 0.1 10*3/uL (ref 0–0.1)
BASOS PCT: 1 %
EOS ABS: 0 10*3/uL (ref 0–0.7)
Eosinophils Relative: 0 %
HCT: 41.2 % (ref 35.0–47.0)
HEMOGLOBIN: 14 g/dL (ref 12.0–16.0)
LYMPHS ABS: 1.5 10*3/uL (ref 1.0–3.6)
Lymphocytes Relative: 14 %
MCH: 29.8 pg (ref 26.0–34.0)
MCHC: 33.9 g/dL (ref 32.0–36.0)
MCV: 88 fL (ref 80.0–100.0)
Monocytes Absolute: 0.6 10*3/uL (ref 0.2–0.9)
Monocytes Relative: 6 %
NEUTROS PCT: 79 %
Neutro Abs: 8.5 10*3/uL — ABNORMAL HIGH (ref 1.4–6.5)
PLATELETS: 337 10*3/uL (ref 150–440)
RBC: 4.68 MIL/uL (ref 3.80–5.20)
RDW: 15.8 % — ABNORMAL HIGH (ref 11.5–14.5)
WBC: 10.8 10*3/uL (ref 3.6–11.0)

## 2015-05-24 LAB — CBC
HCT: 38.3 % (ref 35.0–47.0)
Hemoglobin: 12.6 g/dL (ref 12.0–16.0)
MCH: 29.4 pg (ref 26.0–34.0)
MCHC: 33 g/dL (ref 32.0–36.0)
MCV: 89.1 fL (ref 80.0–100.0)
PLATELETS: 309 10*3/uL (ref 150–440)
RBC: 4.3 MIL/uL (ref 3.80–5.20)
RDW: 16.2 % — ABNORMAL HIGH (ref 11.5–14.5)
WBC: 10.4 10*3/uL (ref 3.6–11.0)

## 2015-05-24 LAB — COMPREHENSIVE METABOLIC PANEL
ALBUMIN: 4 g/dL (ref 3.5–5.0)
ALK PHOS: 105 U/L (ref 38–126)
ALT: 11 U/L — AB (ref 14–54)
ALT: 10 U/L — ABNORMAL LOW (ref 14–54)
ANION GAP: 11 (ref 5–15)
AST: 16 U/L (ref 15–41)
AST: 16 U/L (ref 15–41)
Albumin: 3.3 g/dL — ABNORMAL LOW (ref 3.5–5.0)
Alkaline Phosphatase: 100 U/L (ref 38–126)
Anion gap: 12 (ref 5–15)
BUN: 25 mg/dL — AB (ref 6–20)
BUN: 25 mg/dL — ABNORMAL HIGH (ref 6–20)
CALCIUM: 9.3 mg/dL (ref 8.9–10.3)
CALCIUM: 9.2 mg/dL (ref 8.9–10.3)
CHLORIDE: 99 mmol/L — AB (ref 101–111)
CHLORIDE: 99 mmol/L — AB (ref 101–111)
CO2: 16 mmol/L — AB (ref 22–32)
CO2: 18 mmol/L — AB (ref 22–32)
CREATININE: 0.9 mg/dL (ref 0.44–1.00)
CREATININE: 0.84 mg/dL (ref 0.44–1.00)
GFR calc Af Amer: >60 mL/min (ref >60)
GFR calc non Af Amer: >60 mL/min (ref >60)
GLUCOSE: 492 mg/dL — AB (ref 65–99)
Glucose, Bld: 478 mg/dL — ABNORMAL HIGH (ref 65–99)
Potassium: 4.4 mmol/L (ref 3.5–5.1)
Potassium: 4.2 mmol/L (ref 3.5–5.1)
SODIUM: 127 mmol/L — AB (ref 135–145)
SODIUM: 128 mmol/L — AB (ref 135–145)
Total Bilirubin: 0.7 mg/dL (ref 0.3–1.2)
Total Bilirubin: 0.5 mg/dL (ref 0.3–1.2)
Total Protein: 8.5 g/dL — ABNORMAL HIGH (ref 6.5–8.1)
Total Protein: 7.6 g/dL (ref 6.5–8.1)

## 2015-05-24 LAB — LIPASE, BLOOD: LIPASE: 25 U/L (ref 11–51)

## 2015-05-24 LAB — GLUCOSE, CAPILLARY: Glucose-Capillary: 323 mg/dL — ABNORMAL HIGH (ref 65–99)

## 2015-05-24 MED ORDER — SODIUM CHLORIDE 0.9 % IV SOLN: Freq: Once | INTRAVENOUS | Status: DC

## 2015-05-24 MED ORDER — DIATRIZOATE MEGLUMINE & SODIUM 66-10 % PO SOLN
15.0000 mL | Freq: Once | ORAL | Status: AC
  Administered 2015-05-24: 15 mL via ORAL

## 2015-05-24 MED ORDER — SODIUM CHLORIDE 0.9 % IV SOLN: 1000.0000 mL | Freq: Once | INTRAVENOUS | Status: DC

## 2015-05-24 MED ORDER — HEPARIN SOD (PORK) LOCK FLUSH 100 UNIT/ML IV SOLN
500.0000 [IU] | Freq: Once | INTRAVENOUS | Status: AC
  Administered 2015-05-24: 500 [IU] via INTRAVENOUS

## 2015-05-24 MED ORDER — SODIUM CHLORIDE 0.9 % IV SOLN
Freq: Once | INTRAVENOUS | Status: AC
  Administered 2015-05-24: 16:00:00 via INTRAVENOUS

## 2015-05-24 MED ORDER — HEPARIN SOD (PORK) LOCK FLUSH 100 UNIT/ML IV SOLN
INTRAVENOUS | Status: AC
  Administered 2015-05-24: 500 [IU] via INTRAVENOUS
  Filled 2015-05-24: qty 5

## 2015-05-24 MED ORDER — ONDANSETRON HCL 4 MG/2ML IJ SOLN
4.0000 mg | Freq: Once | INTRAMUSCULAR | Status: AC
  Administered 2015-05-24: 4 mg via INTRAVENOUS
  Filled 2015-05-24: qty 2

## 2015-05-24 MED ORDER — IOPAMIDOL (ISOVUE-300) INJECTION 61%
100.0000 mL | Freq: Once | INTRAVENOUS | Status: AC | PRN
  Administered 2015-05-24: 100 mL via INTRAVENOUS

## 2015-05-24 MED ORDER — HEPARIN SOD (PORK) LOCK FLUSH 100 UNIT/ML IV SOLN
INTRAVENOUS | Status: AC
  Filled 2015-05-24: qty 5

## 2015-05-24 MED ORDER — HYDROMORPHONE HCL 1 MG/ML IJ SOLN
0.5000 mg | Freq: Once | INTRAMUSCULAR | Status: AC
  Administered 2015-05-24: 0.5 mg via INTRAVENOUS
  Filled 2015-05-24: qty 1

## 2015-05-24 NOTE — Progress Notes (Signed)
Got up this morning with n/v. Unable to eat. Presents to clinic with severe n/v.

## 2015-05-24 NOTE — Progress Notes (Signed)
Pt escorted to ED via wheelchair.  

## 2015-05-24 NOTE — ED Provider Notes (Signed)
[facility] Emergency Department Provider Note  ____________________________________________  Time seen: Approximately 3:37 PM  I have reviewed the triage vital signs and the nursing notes.   HISTORY  Chief Complaint No chief complaint on file.  Chief complaint is nausea vomiting weakness.  HPI Zoe [name] is a 55 y.o. female patient complains of feeling no appetite for about 2 days this morning she began having some shaking stomach burning nausea and diarrhea. She also has hot and cold sweats. Belly actually feels like it's on fire she reports. Pain is severe in the lower abdomen. Worse with some movement. Patient went to the cancer center today and was sent here. She feels weak and sweaty.  Past Medical History  Diagnosis Date  . Cancer ([place])     uterine and ovarian cancer  . Diabetes mellitus without complication [facility])     Patient Active Problem List   Diagnosis Date Noted  . Need for vaccination 11/09/2014  . Osteopenia 11/09/2014  . Cancer of uterine adnexa ([place]) 07/10/2014  . DNAR (do not attempt resuscitation) 06/07/2014  . CAFL (chronic airflow limitation) ([place]) 03/09/2014  . Essential (primary) hypertension 03/09/2014  . Diabetes mellitus, type 2 ([place]) 03/09/2014  . Cancer of uterus ([place]) 03/09/2014  . Chronic obstructive pulmonary disease ([place]) 03/09/2014  . Type 2 diabetes mellitus ([place]) 03/09/2014  . Malignant neoplasm of uterus ([place]) 03/09/2014  . Controlled type 2 diabetes mellitus without complication ([place]) [id number]    History reviewed. No pertinent past surgical history.  Current Outpatient Rx  Name  Route  Sig  Dispense  Refill  . ADVAIR DISKUS 250-50 MCG/DOSE AEPB   Inhalation   Inhale 1 puff into the lungs every morning.       1     Dispense as written.   . B-D ULTRAFINE III SHORT PEN 31G X 8 MM MISC            0     Dispense as written.   . Calcium Carbonate-Vitamin D (CALTRATE 600+D) 600-400 MG-UNIT per  tablet   Oral   Take 1 tablet by mouth 2 (two) times daily.         . cyclobenzaprine (FLEXERIL) 5 MG tablet   Oral   Take 1 tablet (5 mg total) by mouth 3 (three) times daily as needed for muscle spasms.   60 tablet   3   . DULoxetine (CYMBALTA) 30 MG capsule   Oral   Take by mouth.         . furosemide (LASIX) 20 MG tablet   Oral   Take 20 mg by mouth. take 2 tablets by mouth once daily         . gabapentin (NEURONTIN) 300 MG capsule      300 mg 4 (four) times daily.       1   . glipiZIDE (GLUCOTROL XL) 5 MG 24 hr tablet   Oral   Take 5 mg by mouth daily with breakfast.       1   . LANTUS SOLOSTAR 100 UNIT/ML Solostar Pen            1     Dispense as written.   Marland Kitchen lisinopril (PRINIVIL,ZESTRIL) 10 MG tablet   Oral   Take 10 mg by mouth daily.       1   . Magnesium 250 MG TABS   Oral   Take 2 tablets (500 mg total) by mouth daily.  0   . megestrol (MEGACE) 40 MG tablet   Oral   Take 2 tablets (80 mg total) by mouth 2 (two) times daily.   120 tablet   3   . metFORMIN (GLUCOPHAGE) 1000 MG tablet   Oral   Take 1,000 mg by mouth 2 (two) times daily with a meal.       0   . montelukast (SINGULAIR) 10 MG tablet      take 1 tablet by mouth once daily every evening   30 tablet   3   . omeprazole (PRILOSEC) 20 MG capsule      take 1 capsule by mouth once daily   30 capsule   3   . ondansetron (ZOFRAN) 4 MG tablet      take 1 tablet by mouth every 8 hours if needed for nausea and vomiting OR REFRACTORY nausea and vomiting   20 tablet   0   . Oxycodone HCl 10 MG TABS   Oral   Take 1 tablet (10 mg total) by mouth 3 (three) times daily as needed.   135 tablet   0   . PROAIR HFA 108 (90 BASE) MCG/ACT inhaler   Inhalation   Inhale 4 puffs into the lungs as needed.       1     Dispense as written.   Marland Kitchen EXPIRED: simvastatin (ZOCOR) 20 MG tablet   Oral   Take 20 mg by mouth daily at 6 PM.          . traZODone (DESYREL) 50 MG  tablet   Oral   Take 50 mg by mouth at bedtime.       0     Allergies Aspirin and Codeine  No family history on file.  Social History Social History  Substance Use Topics  . Smoking status: Current Every Day Smoker  . Smokeless tobacco: None  . Alcohol Use: None    Review of Systems Constitutional: Feels hot and cold. Eyes: No visual changes. ENT: No sore throat. Cardiovascular: Denies chest pain. Respiratory: Denies shortness of breath. Gastrointestinal: See history of present illness Genitourinary: Negative for dysuria. Musculoskeletal: Negative for back pain. Skin: Negative for rash. Neurological: Negative for headaches, focal weakness or numbness.  10-point ROS otherwise negative.  ____________________________________________   PHYSICAL EXAM:  VITAL SIGNS: ED Triage Vitals  Enc Vitals Group     BP 05/24/15 1513 146/80 mmHg     Pulse Rate 05/24/15 1513 123     Resp 05/24/15 1513 22     Temp 05/24/15 1513 97 F (36.1 C)     Temp Source 05/24/15 1513 Oral     SpO2 05/24/15 1513 97 %     Weight 05/24/15 1512 206 lb (93.441 kg)     Height 05/24/15 1512 5\' 3"  (1.6 m)     Head Cir --      Peak Flow --      Pain Score 05/24/15 1512 10     Pain Loc --      Pain Edu? --      Excl. in [place]? --     Constitutional: Alert and oriented.Appears ill Eyes: Conjunctivae are normal. PERRL. EOMI. Head: Atraumatic. Nose: No congestion/rhinnorhea. Mouth/Throat: Mucous membranes are moist.  Oropharynx non-erythematous. Neck: No stridor.  Cardiovascular: Tachycardic regular rhythm. Grossly normal heart sounds.  Good peripheral circulation. Respiratory: Normal respiratory effort.  No retractions. Lungs CTAB. Gastrointestinal: Soft diffusely tender to palpation and percussion especially in the lower abdomen. Decreased. It  is no palpable organomegaly. She has a midline surgical scar from hysterectomy. Musculoskeletal: No lower extremity tenderness nor edema.  No joint  effusions. Neurologic:  Normal speech and language. No gross focal neurologic deficits are appreciated. No gait instability. Skin:  Skin is warm, dry and intact. No rash noted. Psychiatric: Mood and affect are normal. Speech and behavior are normal.  ____________________________________________   LABS (all labs ordered are listed, but only abnormal results are displayed)  Labs Reviewed  CBC - Abnormal; Notable for the following:    RDW 16.2 (*)    All other components within normal limits  COMPREHENSIVE METABOLIC PANEL - Abnormal; Notable for the following:    Sodium 128 (*)    Chloride 99 (*)    CO2 18 (*)    Glucose, Bld 478 (*)    BUN 25 (*)    Albumin 3.3 (*)    ALT 10 (*)    All other components within normal limits  BLOOD GAS, VENOUS - Abnormal; Notable for the following:    pCO2, Ven 33 (*)    Bicarbonate 20.0 (*)    All other components within normal limits  GLUCOSE, CAPILLARY - Abnormal; Notable for the following:    Glucose-Capillary 323 (*)    All other components within normal limits  LIPASE, BLOOD  URINALYSIS COMPLETEWITH MICROSCOPIC ([facility] ONLY)  CBG MONITORING, ED   ____________________________________________  EKG   ____________________________________________  RADIOLOGY  CT scan shows evidence of new metastasis on the liver. ____________________________________________   PROCEDURES    ____________________________________________   INITIAL IMPRESSION / ASSESSMENT AND PLAN / ED COURSE  Pertinent labs & imaging results that were available during my care of the patient were reviewed by me and considered in my medical decision making (see chart for details). Patient having diarrhea in the ER at least twice. Reports is green and liquidy. I offered her admission recommended admission tried to insist on admission patient wants to go home she will take Pepto-Bismol at home will follow-up with her doctor and come back if she is worse. She has before  prescription for 10 mg oxycodone because she is out of it.  ____________________________________________   FINAL CLINICAL IMPRESSION(S) / ED DIAGNOSES  Final diagnoses:  Diarrhea of presumed infectious origin  Abdominal pain, unspecified abdominal location  Metastatic disease [facility])      Nena Polio, MD 05/24/15 2116

## 2015-05-24 NOTE — ED Notes (Signed)
brought over from cancer center with nausea/vomiting  States sxs' started this am  Pt is actively vomiting in triage

## 2015-05-24 NOTE — ED Notes (Signed)
Patient transported to CT 

## 2015-05-24 NOTE — Discharge Instructions (Signed)
Abdominal Pain, Adult Many things can cause abdominal pain. Usually, abdominal pain is not caused by a disease and will improve without treatment. It can often be observed and treated at home. Your health care provider will do a physical exam and possibly order blood tests and X-rays to help determine the seriousness of your pain. However, in many cases, more time must pass before a clear cause of the pain can be found. Before that point, your health care provider may not know if you need more testing or further treatment. HOME CARE INSTRUCTIONS Monitor your abdominal pain for any changes. The following actions may help to alleviate any discomfort you are experiencing:  Only take over-the-counter or prescription medicines as directed by your health care provider.  Do not take laxatives unless directed to do so by your health care provider.  Try a clear liquid diet (broth, tea, or water) as directed by your health care provider. Slowly move to a bland diet as tolerated. SEEK MEDICAL CARE IF:  You have unexplained abdominal pain.  You have abdominal pain associated with nausea or diarrhea.  You have pain when you urinate or have a bowel movement.  You experience abdominal pain that wakes you in the night.  You have abdominal pain that is worsened or improved by eating food.  You have abdominal pain that is worsened with eating fatty foods.  You have a fever. SEEK IMMEDIATE MEDICAL CARE IF:  Your pain does not go away within 2 hours.  You keep throwing up (vomiting).  Your pain is felt only in portions of the abdomen, such as the right side or the left lower portion of the abdomen.  You pass bloody or black tarry stools. MAKE SURE YOU:  Understand these instructions.  Will watch your condition.  Will get help right away if you are not doing well or get worse.   This information is not intended to replace advice given to you by your health care provider. Make sure you discuss  any questions you have with your health care provider.   Document Released: 11/26/2004 Document Revised: 11/07/2014 Document Reviewed: 10/26/2012 Elsevier Interactive Patient Education 2016 [employer].   Please use Pepto-Bismol for the diarrhea. Usually oxycodone as needed. Call the Cancer Ctr[facility] Monday morning and arrange for follow-up. There is someone on call this weekend he can call them this weekend if she went to the emergency return if you need to. I will be here tomorrow as well. He sure to drink plenty of fluids. Please again return if you're worse.

## 2015-05-25 ENCOUNTER — Encounter: Payer: Self-pay | Admitting: Oncology

## 2015-05-25 LAB — CA 125: CA 125: 105.5 U/mL — ABNORMAL HIGH (ref 0.0–38.1)

## 2015-05-25 NOTE — Progress Notes (Signed)
[place] @ [facility] [phone]  [phone]     CASEE KNEPP OB: [birth year]  MR#: [id number]  DXI#:[id number]  Patient Care Team: Sharyne Peach, MD as PCP - General (Family Medicine)  CHIEF COMPLAINT:  Chief Complaint  Patient presents with  . Uterine Cancer    Oncology History   Chief Complaint/Diagnosis:   1. Endometroid adenocarcinoma, stage IIIC, grade 3, TAHBSO and complete staging. 2. Carboplatin and paclitaxel x 3.  XRT, treatments delayed due to multiple factors. 3. CT 03/20/14 Findings are compatible with widespread metastatic disease.Specifically, there appears to be local recurrence of disease in the low anatomic pelvis with and ill-defined amorphous soft tissue mass extending from the right side of the vaginal apex to the pelvic sidewall, causing some mild obstruction at the junction of distal and middle third of the right ureter (with mild proximal right hydroureteronephrosis), widespread omental and intraperitoneal metastases, retroperitoneal lymphadenopathy and new pulmonary nodules, as detailed above. 4. February 2016, Biopsy of abdominal mass is positive for malignancy consistent with endometrial cancer. 5. Started Carboplatin AUC 5 and Taxol 151m/m2, 04/17/2014.     Can[place] of uterine adnexa ([facility]   07/10/2014 Initial Diagnosis Cancer of uterine adnexa    [place]cer of uterus ([facility]   03/09/2014 Initial Diagnosis Cancer of uterus    Oncology Flowsheet 07/10/2014 07/24/2014 07/31/2014 05/24/2015  Day, Cycle Day 1, 4 - Day 1, 6 -  CARBOplatin (PARAPLATIN) IV 680 mg - 560 mg -  dexamethasone (DECADRON) IV [ 12 mg ] - [ 12 mg ] -  fosaprepitant (EMEND) IV [ 150 mg ] - [ 150 mg ] -  LORazepam (ATIVAN) IV 1 mg 1 mg 1 mg -  ondansetron (ZOFRAN) IV - - - 4 mg  PACLitaxel (TAXOL) IV 200 mg/m2 - 175 mg/m2 -  palonosetron (ALOXI) IV 0.25 mg - 0.25 mg -    INTERVAL HISTORY: 55year old lady came today further follow-up regarding her recurrent endometrial  cancer.  Patient is here for ongoing evaluation and treatment consideration.  Has persistent weakness in both lower extremity failed during the New Year's Eve.  Abdominal cramps.  No nausea no vomiting.  Tingling numbness and lower extremity persists.  Patient is rising tumor marker Continues to be very depressed. Patient had a CT scan.  Here to discuss the result patient also has progressing and rising tumor markers.  Patient does not want any further chemotherapy.  Patient did not like Megace.  Patient came today complaining of persistent nausea started throwing up increasing abdominal pain patient was tachycardic extremely agitated.   REVIEW OF SYSTEMS:   GENERAL:  Feels good.  Active.  No fevers, sweats or weight loss. Lining performance status.  Described above patient was in extreme agitation.  Persistent nausea or throwing up increasing abdominal discomfort PERFORMANCE STATUS (ECOG):  01 HEENT:  No visual changes, runny nose, sore throat, mouth sores or tenderness. Lungs: No shortness of breath or cough.  No hemoptysis. Cardiac:  No chest pain, palpitations, orthopnea, or PND. GI:  No nausea, vomiting, diarrhea, constipation, melena or hematochezia. GU:  No urgency, frequency, dysuria, or hematuria. Musculoskeletal:  No back pain.  No joint pain.  No muscle tenderness. Extremities:  No pain or swelling. Skin:  No rashes or skin changes. Neuro:  No headache, numbness or weakness, balance or coordination issues. Endocrine:  No diabetes, thyroid issues, hot flashes or night sweats. Psych:  No mood changes, depression or anxiety. Pain:  No focal pain. Review of systems:  All  other systems reviewed and found to be negative.  As per HPI. Otherwise, a complete review of systems is negatve.     Additional Past Medical and Surgical History: Past Medical History/Past Surgical History -  Diabetes mellitus  Chronic obstructive pulmonary disease  Allergic rhinitis  Depression  Chronic  smoker  Chronic leukocytosis, thrombocytosis, erythrocytosis  Proteinuria, on Accupril started in 2003  Venous stasis  Peripheral neuropathy  Stomach ulcer 1980 with GI bleed  Arthritis right shoulder  Cholecystectomy  Anxiety/depression  Cataracts    Family History - remarkable for diabetes, hyperlipidemia, hypertension, heart disease, bladder cancer and leukemia.    Social History - chronic smoker 45-pack-years, ongoing.  Denies alcohol or recreational drug usage.  Physically active and ambulatory.      ADVANCED DIRECTIVES: Patient does not have any advanced healthcare directive. Information has been given.   HEALTH MAINTENANCE: Social History  Substance Use Topics  . Smoking status: Current Every Day Smoker  . Smokeless tobacco: None  . Alcohol Use: None     Allergies  Allergen Reactions  . Aspirin     Other reaction(s): Unknown  . Codeine     Other reaction(s): Unknown    Current Outpatient Prescriptions  Medication Sig Dispense Refill  . ADVAIR DISKUS 250-50 MCG/DOSE AEPB Inhale 1 puff into the lungs every morning.   1  . B-D ULTRAFINE III SHORT PEN 31G X 8 MM MISC   0  . Calcium Carbonate-Vitamin D (CALTRATE 600+D) 600-400 MG-UNIT per tablet Take 1 tablet by mouth 2 (two) times daily.    . cyclobenzaprine (FLEXERIL) 5 MG tablet Take 1 tablet (5 mg total) by mouth 3 (three) times daily as needed for muscle spasms. 60 tablet 3  . DULoxetine (CYMBALTA) 30 MG capsule Take by mouth.    . furosemide (LASIX) 20 MG tablet Take 20 mg by mouth. take 2 tablets by mouth once daily    . gabapentin (NEURONTIN) 300 MG capsule 300 mg 4 (four) times daily.   1  . glipiZIDE (GLUCOTROL XL) 5 MG 24 hr tablet Take 5 mg by mouth daily with breakfast.   1  . LANTUS SOLOSTAR 100 UNIT/ML Solostar Pen   1  . lisinopril (PRINIVIL,ZESTRIL) 10 MG tablet Take 10 mg by mouth daily.   1  . Magnesium 250 MG TABS Take 2 tablets (500 mg total) by mouth daily.  0  . megestrol (MEGACE) 40 MG  tablet Take 2 tablets (80 mg total) by mouth 2 (two) times daily. 120 tablet 3  . metFORMIN (GLUCOPHAGE) 1000 MG tablet Take 1,000 mg by mouth 2 (two) times daily with a meal.   0  . montelukast (SINGULAIR) 10 MG tablet take 1 tablet by mouth once daily every evening 30 tablet 3  . omeprazole (PRILOSEC) 20 MG capsule take 1 capsule by mouth once daily 30 capsule 3  . ondansetron (ZOFRAN) 4 MG tablet take 1 tablet by mouth every 8 hours if needed for nausea and vomiting OR REFRACTORY nausea and vomiting 20 tablet 0  . Oxycodone HCl 10 MG TABS Take 1 tablet (10 mg total) by mouth 3 (three) times daily as needed. 135 tablet 0  . PROAIR HFA 108 (90 BASE) MCG/ACT inhaler Inhale 4 puffs into the lungs as needed.   1  . traZODone (DESYREL) 50 MG tablet Take 50 mg by mouth at bedtime.   0  . simvastatin (ZOCOR) 20 MG tablet Take 20 mg by mouth daily at 6 PM.  No current facility-administered medications for this visit.    OBJECTIVE:  Filed Vitals:   05/24/15 1440  BP: 144/96  Pulse: 144  Temp: 96.5 F (35.8 C)  Resp: 20     There is no weight on file to calculate BMI.    ECOG FS:1 - Symptomatic but completely ambulatory  PHYSICAL EXAM: General status: Performance status is good.  Patient has not lost significant weight HEENT: No evidence of stomatitis.  And alopecia Sclera and conjunctivae :: No jaundice.   pale looking . Lungs: Air  entry equal on both sides.  No rhonchi.  No rales.  Cardiac: Heart sounds are normal.  No pericardial rub.  No murmur. Lymphatic system: Cervical, axillary, inguinal, lymph nodes not palpable GI: Abdomen is soft.  No ascites.  Liver spleen not palpable.  No tenderness.  Bowel sounds are within normal limit Lower extremity: No edema Neurological system: Higher functions, cranial nerves intact.  Peripheral neuropathy  No evidence of peripheral neuropathy. Skin: No rash.  No ecchymosis.[facility] system : Patient's mood has improved.  [facility]  .Sugar is  extremely high.  Sodium is low. I discussed with patient that she needs to contact primary care physician regarding diabetes.  Possibility of running any PET scan would be considered to evaluate for progression of disease. LAB RESULTS:  Admission on 05/24/2015, Discharged on 05/24/2015  Component Date Value Ref Range Status  . WBC 05/24/2015 10.4  3.6 - 11.0 K/uL Final  . RBC 05/24/2015 4.30  3.80 - 5.20 MIL/uL Final  . Hemoglobin 05/24/2015 12.6  12.0 - 16.0 g/dL Final  . HCT 05/24/2015 38.3  35.0 - 47.0 % Final  . MCV 05/24/2015 89.1  80.0 - 100.0 fL Final  . MCH 05/24/2015 29.4  26.0 - 34.0 pg Final  . MCHC 05/24/2015 33.0  32.0 - 36.0 g/dL Final  . RDW 05/24/2015 16.2* 11.5 - 14.5 % Final  . Platelets 05/24/2015 309  150 - 440 K/uL Final  . Sodium 05/24/2015 128* 135 - 145 mmol/L Final  . Potassium 05/24/2015 4.2  3.5 - 5.1 mmol/L Final  . Chloride 05/24/2015 99* 101 - 111 mmol/L Final  . CO2 05/24/2015 18* 22 - 32 mmol/L Final  . Glucose, Bld 05/24/2015 478* 65 - 99 mg/dL Final  . BUN 05/24/2015 25* 6 - 20 mg/dL Final  . Creatinine, Ser 05/24/2015 0.84  0.44 - 1.00 mg/dL Final  . Calcium 05/24/2015 9.2  8.9 - 10.3 mg/dL Final  . Total Protein 05/24/2015 7.6  6.5 - 8.1 g/dL Final  . Albumin 05/24/2015 3.3* 3.5 - 5.0 g/dL Final  . AST 05/24/2015 16  15 - 41 U/L Final  . ALT 05/24/2015 10* 14 - 54 U/L Final  . Alkaline Phosphatase 05/24/2015 100  38 - 126 U/L Final  . Total Bilirubin 05/24/2015 0.5  0.3 - 1.2 mg/dL Final  . GFR calc non Af Amer 05/24/2015 >60  >60 mL/min Final  . GFR calc Af Amer 05/24/2015 >60  >60 mL/min Final   Comment: (NOTE) The eGFR has been calculated using the CKD EPI equation. This calculation has not been validated in all clinical situations. eGFR's persistently <60 mL/min signify possible Chronic Kidney Disease.   . Anion gap 05/24/2015 11  5 - 15 Final  . pH, Ven 05/24/2015 7.39  7.320 - 7.430 Final  . pCO2, Ven 05/24/2015 33* 44.0 - 60.0 mmHg  Final  . pO2, Ven 05/24/2015 PENDING  31.0 - 45.0 mmHg Incomplete  . Bicarbonate 05/24/2015 20.0*  21.0 - 28.0 mEq/L Final  . O2 Saturation 05/24/2015 58.8   Final  . Patient temperature 05/24/2015 37.0   Final  . Collection site 05/24/2015 VENOUS   Final  . Sample type 05/24/2015 VENOUS   Final  . Lipase 05/24/2015 25  11 - 51 U/L Final  . Glucose-Capillary 05/24/2015 323* 65 - 99 mg/dL Final  Appointment on 05/24/2015  Component Date Value Ref Range Status  . WBC 05/24/2015 10.8  3.6 - 11.0 K/uL Final  . RBC 05/24/2015 4.68  3.80 - 5.20 MIL/uL Final  . Hemoglobin 05/24/2015 14.0  12.0 - 16.0 g/dL Final  . HCT 05/24/2015 41.2  35.0 - 47.0 % Final  . MCV 05/24/2015 88.0  80.0 - 100.0 fL Final  . MCH 05/24/2015 29.8  26.0 - 34.0 pg Final  . MCHC 05/24/2015 33.9  32.0 - 36.0 g/dL Final  . RDW 05/24/2015 15.8* 11.5 - 14.5 % Final  . Platelets 05/24/2015 337  150 - 440 K/uL Final  . Neutrophils Relative % 05/24/2015 79   Final  . Neutro Abs 05/24/2015 8.5* 1.4 - 6.5 K/uL Final  . Lymphocytes Relative 05/24/2015 14   Final  . Lymphs Abs 05/24/2015 1.5  1.0 - 3.6 K/uL Final  . Monocytes Relative 05/24/2015 6   Final  . Monocytes Absolute 05/24/2015 0.6  0.2 - 0.9 K/uL Final  . Eosinophils Relative 05/24/2015 0   Final  . Eosinophils Absolute 05/24/2015 0.0  0 - 0.7 K/uL Final  . Basophils Relative 05/24/2015 1   Final  . Basophils Absolute 05/24/2015 0.1  0 - 0.1 K/uL Final  . Sodium 05/24/2015 127* 135 - 145 mmol/L Final  . Potassium 05/24/2015 4.4  3.5 - 5.1 mmol/L Final  . Chloride 05/24/2015 99* 101 - 111 mmol/L Final  . CO2 05/24/2015 16* 22 - 32 mmol/L Final  . Glucose, Bld 05/24/2015 492* 65 - 99 mg/dL Final  . BUN 05/24/2015 25* 6 - 20 mg/dL Final  . Creatinine, Ser 05/24/2015 0.90  0.44 - 1.00 mg/dL Final  . Calcium 05/24/2015 9.3  8.9 - 10.3 mg/dL Final  . Total Protein 05/24/2015 8.5* 6.5 - 8.1 g/dL Final  . Albumin 05/24/2015 4.0  3.5 - 5.0 g/dL Final  . AST 05/24/2015 16   15 - 41 U/L Final  . ALT 05/24/2015 11* 14 - 54 U/L Final  . Alkaline Phosphatase 05/24/2015 105  38 - 126 U/L Final  . Total Bilirubin 05/24/2015 0.7  0.3 - 1.2 mg/dL Final  . GFR calc non Af Amer 05/24/2015 >60  >60 mL/min Final  . GFR calc Af Amer 05/24/2015 >60  >60 mL/min Final   Comment: (NOTE) The eGFR has been calculated using the CKD EPI equation. This calculation has not been validated in all clinical situations. eGFR's persistently <60 mL/min signify possible Chronic Kidney Disease.   . Anion gap 05/24/2015 12  5 - 15 Final  . CA 125 05/24/2015 105.5* 0.0 - 38.1 U/mL Final   Comment: (NOTE) [employer] ECLIA methodology Performed At: [facility] [address] [place], [place] [id number] Lindon Romp MD GN:[id number]     Lab Results  Component Value Date   CA125 105.5* 05/24/2015     ASSESSMENT: Stage IV endometrial cancer After quick examination patient was found to be very tremulous tachycardic in distress so patient was transferred to the emergency room  Reviewing lab data patient was also found to be hyperglycemic   Cancer of uterus   Staging form: Corpus Uteri -  Carcinoma, AJCC 7th Edition     Clinical: Stage IVB (T3b, N0, M1) - Unsigned   Forest Gleason, MD   05/25/2015 5:05 AM

## 2015-05-26 ENCOUNTER — Other Ambulatory Visit: Payer: Self-pay | Admitting: Oncology

## 2015-05-28 LAB — BLOOD GAS, VENOUS
BICARBONATE: 20 meq/L — AB (ref 21.0–28.0)
O2 Saturation: 58.8 %
PATIENT TEMPERATURE: 37
PCO2 VEN: 33 mmHg — AB (ref 44.0–60.0)
PH VEN: 7.39 (ref 7.320–7.430)

## 2015-06-19 ENCOUNTER — Inpatient Hospital Stay: Payer: Medicaid Other | Attending: Oncology | Admitting: Oncology

## 2015-06-19 VITALS — BP 156/88 | HR 102 | Temp 98.1°F | Resp 18 | Wt 196.5 lb

## 2015-06-19 DIAGNOSIS — F17210 Nicotine dependence, cigarettes, uncomplicated: Secondary | ICD-10-CM | POA: Insufficient documentation

## 2015-06-19 DIAGNOSIS — E119 Type 2 diabetes mellitus without complications: Secondary | ICD-10-CM | POA: Insufficient documentation

## 2015-06-19 DIAGNOSIS — F329 Major depressive disorder, single episode, unspecified: Secondary | ICD-10-CM | POA: Insufficient documentation

## 2015-06-19 DIAGNOSIS — Z806 Family history of leukemia: Secondary | ICD-10-CM | POA: Diagnosis not present

## 2015-06-19 DIAGNOSIS — Z7984 Long term (current) use of oral hypoglycemic drugs: Secondary | ICD-10-CM | POA: Insufficient documentation

## 2015-06-19 DIAGNOSIS — C55 Malignant neoplasm of uterus, part unspecified: Secondary | ICD-10-CM

## 2015-06-19 DIAGNOSIS — D72829 Elevated white blood cell count, unspecified: Secondary | ICD-10-CM

## 2015-06-19 DIAGNOSIS — R112 Nausea with vomiting, unspecified: Secondary | ICD-10-CM | POA: Diagnosis not present

## 2015-06-19 DIAGNOSIS — D473 Essential (hemorrhagic) thrombocythemia: Secondary | ICD-10-CM | POA: Diagnosis not present

## 2015-06-19 DIAGNOSIS — R531 Weakness: Secondary | ICD-10-CM | POA: Insufficient documentation

## 2015-06-19 DIAGNOSIS — N1330 Unspecified hydronephrosis: Secondary | ICD-10-CM | POA: Insufficient documentation

## 2015-06-19 DIAGNOSIS — C541 Malignant neoplasm of endometrium: Secondary | ICD-10-CM | POA: Diagnosis not present

## 2015-06-19 DIAGNOSIS — M129 Arthropathy, unspecified: Secondary | ICD-10-CM | POA: Insufficient documentation

## 2015-06-19 DIAGNOSIS — Z79899 Other long term (current) drug therapy: Secondary | ICD-10-CM | POA: Insufficient documentation

## 2015-06-19 DIAGNOSIS — Z923 Personal history of irradiation: Secondary | ICD-10-CM | POA: Insufficient documentation

## 2015-06-19 DIAGNOSIS — Z90722 Acquired absence of ovaries, bilateral: Secondary | ICD-10-CM | POA: Insufficient documentation

## 2015-06-19 DIAGNOSIS — G629 Polyneuropathy, unspecified: Secondary | ICD-10-CM | POA: Diagnosis not present

## 2015-06-19 DIAGNOSIS — J449 Chronic obstructive pulmonary disease, unspecified: Secondary | ICD-10-CM | POA: Diagnosis not present

## 2015-06-19 DIAGNOSIS — Z8711 Personal history of peptic ulcer disease: Secondary | ICD-10-CM | POA: Insufficient documentation

## 2015-06-19 DIAGNOSIS — Z8052 Family history of malignant neoplasm of bladder: Secondary | ICD-10-CM | POA: Insufficient documentation

## 2015-06-19 DIAGNOSIS — Z90710 Acquired absence of both cervix and uterus: Secondary | ICD-10-CM | POA: Insufficient documentation

## 2015-06-19 DIAGNOSIS — I878 Other specified disorders of veins: Secondary | ICD-10-CM | POA: Diagnosis not present

## 2015-06-19 DIAGNOSIS — Z794 Long term (current) use of insulin: Secondary | ICD-10-CM | POA: Insufficient documentation

## 2015-06-19 DIAGNOSIS — C786 Secondary malignant neoplasm of retroperitoneum and peritoneum: Secondary | ICD-10-CM | POA: Diagnosis not present

## 2015-06-19 DIAGNOSIS — D751 Secondary polycythemia: Secondary | ICD-10-CM | POA: Diagnosis not present

## 2015-06-19 DIAGNOSIS — R109 Unspecified abdominal pain: Secondary | ICD-10-CM | POA: Diagnosis not present

## 2015-06-19 DIAGNOSIS — R978 Other abnormal tumor markers: Secondary | ICD-10-CM | POA: Diagnosis not present

## 2015-06-19 DIAGNOSIS — Z9221 Personal history of antineoplastic chemotherapy: Secondary | ICD-10-CM | POA: Diagnosis not present

## 2015-06-19 DIAGNOSIS — R809 Proteinuria, unspecified: Secondary | ICD-10-CM | POA: Diagnosis not present

## 2015-06-19 MED ORDER — OXYCODONE HCL 10 MG PO TABS: 10.0000 mg | ORAL_TABLET | Freq: Three times a day (TID) | ORAL | Status: DC | PRN

## 2015-06-19 MED ORDER — CYCLOBENZAPRINE HCL 10 MG PO TABS: 10.0000 mg | ORAL_TABLET | Freq: Two times a day (BID) | ORAL | Status: DC

## 2015-06-19 NOTE — Progress Notes (Signed)
Patient last seen here in March.  Patient presented with n/v at that time with elevated blood glucose.  Patient transferred to ED.  Patient states a CT was performed that shows metastasis to her liver.  States her blood glucose was elevated due to her taking Megace.  Patient wants MD to look at CT during today's visit.

## 2015-06-23 ENCOUNTER — Encounter: Payer: Self-pay | Admitting: Oncology

## 2015-06-23 NOTE — Progress Notes (Signed)
[place] @ [facility] [phone]  [phone]     Zoe [name] OB: 09-Oct-1960  MR#: [id number]  JJK#:[id number]  Patient Care Team: Sharyne Peach, MD as PCP - General (Family Medicine) Algernon Huxley, MD as Consulting Physician (Vascular Surgery)  CHIEF COMPLAINT:  Chief Complaint  Patient presents with  . Uterine Cancer    Oncology History   Chief Complaint/Diagnosis:   1. Endometroid adenocarcinoma, stage IIIC, grade 3, TAHBSO and complete staging. 2. Carboplatin and paclitaxel x 3.  XRT, treatments delayed due to multiple factors. 3. CT 03/20/14 Findings are compatible with widespread metastatic disease.Specifically, there appears to be local recurrence of disease in the low anatomic pelvis with and ill-defined amorphous soft tissue mass extending from the right side of the vaginal apex to the pelvic sidewall, causing some mild obstruction at the junction of distal and middle third of the right ureter (with mild proximal right hydroureteronephrosis), widespread omental and intraperitoneal metastases, retroperitoneal lymphadenopathy and new pulmonary nodules, as detailed above. 4. February 2016, Biopsy of abdominal mass is positive for malignancy consistent with endometrial cancer. 5. Started Carboplatin AUC 5 and Taxol '175mg'$ /m2, 04/17/2014.     Cancer of uterine adnexa ([place])   07/10/2014 Initial Diagnosis Cancer of uterine adnexa    Cancer of uterus ([place])   03/09/2014 Initial Diagnosis Cancer of uterus      INTERVAL HISTORY: 55 year old lady came today further follow-up regarding her recurrent endometrial cancer.  Patient is here for ongoing evaluation and treatment consideration.  Has persistent weakness in both lower extremity failed during the New Year's Eve.  Abdominal cramps.  No nausea no vomiting.  Tingling numbness and lower extremity persists.  Patient is rising tumor marker Continues to be very depressed. Patient had a CT scan.  Here to discuss the  result patient also has progressing and rising tumor markers.  Patient does not want any further chemotherapy.  Patient did not like Megace  Patient was last seen in our office in March of 201772 emergency room because of severe pain patient was quite emotional crying in distress.  During emergency room evaluation a CT scan was done which revealed progressive disease.  The patient was started on pain medication blood sugar was very high at received insulin and IV fluid According to patient most of her complaints were secondary to Megace and she has decided to stop Megace. She  is here for further follow-up and treatment consideration Nausea vomiting and abdominal pain is improved According to her diabetes is under better control after discontinuing Megace   REVIEW OF SYSTEMS:   GENERAL:  Feels good.  Active.  No fevers, sweats or weight loss. PERFORMANCE STATUS (ECOG):  01 HEENT:  No visual changes, runny nose, sore throat, mouth sores or tenderness. Lungs: No shortness of breath or cough.  No hemoptysis. Cardiac:  No chest pain, palpitations, orthopnea, or PND. GI:  No nausea, vomiting, diarrhea, constipation, melena or hematochezia. GU:  No urgency, frequency, dysuria, or hematuria. Musculoskeletal:  No back pain.  No joint pain.  No muscle tenderness. Extremities:  No pain or swelling. Skin:  No rashes or skin changes. Neuro:  No headache, numbness or weakness, balance or coordination issues. Endocrine:  No diabetes, thyroid issues, hot flashes or night sweats. Psych:  No mood changes, depression or anxiety. Pain:  No focal pain. Review of systems:  All other systems reviewed and found to be negative.  As per HPI. Otherwise, a complete review of systems is negatve.  Additional Past Medical and Surgical History: Past Medical History/Past Surgical History -  Diabetes mellitus  Chronic obstructive pulmonary disease  Allergic rhinitis  Depression  Chronic smoker  Chronic  leukocytosis, thrombocytosis, erythrocytosis  Proteinuria, on Accupril started in 2003  Venous stasis  Peripheral neuropathy  Stomach ulcer 1980 with GI bleed  Arthritis right shoulder  Cholecystectomy  Anxiety/depression  Cataracts    Family History - remarkable for diabetes, hyperlipidemia, hypertension, heart disease, bladder cancer and leukemia.    Social History - chronic smoker 45-pack-years, ongoing.  Denies alcohol or recreational drug usage.  Physically active and ambulatory.      ADVANCED DIRECTIVES: Patient does not have any advanced healthcare directive. Information has been given.   HEALTH MAINTENANCE: Social History  Substance Use Topics  . Smoking status: Current Every Day Smoker  . Smokeless tobacco: None  . Alcohol Use: None     Allergies  Allergen Reactions  . Aspirin     Other reaction(s): Unknown  . Codeine     Other reaction(s): Unknown    Current Outpatient Prescriptions  Medication Sig Dispense Refill  . ADVAIR DISKUS 250-50 MCG/DOSE AEPB Inhale 1 puff into the lungs every morning.   1  . B-D ULTRAFINE III SHORT PEN 31G X 8 MM MISC   0  . Calcium Carbonate-Vitamin D (CALTRATE 600+D) 600-400 MG-UNIT per tablet Take 1 tablet by mouth 2 (two) times daily.    . cyclobenzaprine (FLEXERIL) 10 MG tablet Take 1 tablet (10 mg total) by mouth 2 (two) times daily. 60 tablet 3  . DULoxetine (CYMBALTA) 30 MG capsule Take by mouth.    . furosemide (LASIX) 20 MG tablet Take 20 mg by mouth. take 2 tablets by mouth once daily    . gabapentin (NEURONTIN) 300 MG capsule 300 mg 4 (four) times daily.   1  . glipiZIDE (GLUCOTROL XL) 5 MG 24 hr tablet Take 5 mg by mouth daily with breakfast.   1  . LANTUS SOLOSTAR 100 UNIT/ML Solostar Pen   1  . lisinopril (PRINIVIL,ZESTRIL) 10 MG tablet Take 10 mg by mouth daily.   1  . Magnesium 250 MG TABS Take 2 tablets (500 mg total) by mouth daily.  0  . metFORMIN (GLUCOPHAGE) 1000 MG tablet Take 1,000 mg by mouth 2 (two) times  daily with a meal.   0  . montelukast (SINGULAIR) 10 MG tablet take 1 tablet by mouth once daily every evening 30 tablet 3  . omeprazole (PRILOSEC) 20 MG capsule take 1 capsule by mouth once daily 30 capsule 3  . ondansetron (ZOFRAN) 4 MG tablet take 1 tablet by mouth every 8 hours if needed for nausea and vomiting OR REFRACTORY NAUSEA AND VOMITING 20 tablet 0  . Oxycodone HCl 10 MG TABS Take 1 tablet (10 mg total) by mouth 3 (three) times daily as needed. 90 tablet 0  . PROAIR HFA 108 (90 BASE) MCG/ACT inhaler Inhale 4 puffs into the lungs as needed.   1  . traZODone (DESYREL) 50 MG tablet Take 50 mg by mouth at bedtime.   0  . cyclobenzaprine (FLEXERIL) 5 MG tablet take 1 tablet by mouth three times a day if needed  0  . simvastatin (ZOCOR) 20 MG tablet Take 20 mg by mouth daily at 6 PM.      No current facility-administered medications for this visit.    OBJECTIVE:  Filed Vitals:   06/19/15 1510  BP: 156/88  Pulse: 102  Temp: 98.1 F (36.7 C)  Resp: 18     Body mass index is 34.82 kg/(m^2).    ECOG FS:1 - Symptomatic but completely ambulatory  PHYSICAL EXAM: General status: Performance status is good.  Patient has not lost significant weight HEENT: No evidence of stomatitis.  And alopecia Sclera and conjunctivae :: No jaundice.   pale looking . Lungs: Air  entry equal on both sides.  No rhonchi.  No rales.  Cardiac: Heart sounds are normal.  No pericardial rub.  No murmur. Lymphatic system: Cervical, axillary, inguinal, lymph nodes not palpable GI: Abdomen is soft.  No ascites.  Liver spleen not palpable.  No tenderness.  Bowel sounds are within normal limit Lower extremity: No edema Neurological system: Higher functions, cranial nerves intact.  Peripheral neuropathy  No evidence of peripheral neuropathy. Skin: No rash.  No ecchymosis.[facility] system : Patient's mood has improved.  [facility]  .Sugar is extremely high.  Sodium is low. I discussed with patient that she needs to  contact primary care physician regarding diabetes.  Possibility of running any PET scan would be considered to evaluate for progression of disease. LAB RESULTS:  No visits with results within 3 Day(s) from this visit. Latest known visit with results is:  Admission on 05/24/2015, Discharged on 05/24/2015  Component Date Value Ref Range Status  . WBC 05/24/2015 10.4  3.6 - 11.0 K/uL Final  . RBC 05/24/2015 4.30  3.80 - 5.20 MIL/uL Final  . Hemoglobin 05/24/2015 12.6  12.0 - 16.0 g/dL Final  . HCT 05/24/2015 38.3  35.0 - 47.0 % Final  . MCV 05/24/2015 89.1  80.0 - 100.0 fL Final  . MCH 05/24/2015 29.4  26.0 - 34.0 pg Final  . MCHC 05/24/2015 33.0  32.0 - 36.0 g/dL Final  . RDW 05/24/2015 16.2* 11.5 - 14.5 % Final  . Platelets 05/24/2015 309  150 - 440 K/uL Final  . Sodium 05/24/2015 128* 135 - 145 mmol/L Final  . Potassium 05/24/2015 4.2  3.5 - 5.1 mmol/L Final  . Chloride 05/24/2015 99* 101 - 111 mmol/L Final  . CO2 05/24/2015 18* 22 - 32 mmol/L Final  . Glucose, Bld 05/24/2015 478* 65 - 99 mg/dL Final  . BUN 05/24/2015 25* 6 - 20 mg/dL Final  . Creatinine, Ser 05/24/2015 0.84  0.44 - 1.00 mg/dL Final  . Calcium 05/24/2015 9.2  8.9 - 10.3 mg/dL Final  . Total Protein 05/24/2015 7.6  6.5 - 8.1 g/dL Final  . Albumin 05/24/2015 3.3* 3.5 - 5.0 g/dL Final  . AST 05/24/2015 16  15 - 41 U/L Final  . ALT 05/24/2015 10* 14 - 54 U/L Final  . Alkaline Phosphatase 05/24/2015 100  38 - 126 U/L Final  . Total Bilirubin 05/24/2015 0.5  0.3 - 1.2 mg/dL Final  . GFR calc non Af Amer 05/24/2015 >60  >60 mL/min Final  . GFR calc Af Amer 05/24/2015 >60  >60 mL/min Final   Comment: (NOTE) The eGFR has been calculated using the CKD EPI equation. This calculation has not been validated in all clinical situations. eGFR's persistently <60 mL/min signify possible Chronic Kidney Disease.   . Anion gap 05/24/2015 11  5 - 15 Final  . pH, Ven 05/24/2015 7.39  7.320 - 7.430 Final  . pCO2, Ven 05/24/2015 33*  44.0 - 60.0 mmHg Final  . Bicarbonate 05/24/2015 20.0* 21.0 - 28.0 mEq/L Final  . O2 Saturation 05/24/2015 58.8   Final  . Patient temperature 05/24/2015 37.0   Final  . Collection site 05/24/2015 VENOUS  Final  . Sample type 05/24/2015 VENOUS   Final  . Lipase 05/24/2015 25  11 - 51 U/L Final  . Glucose-Capillary 05/24/2015 323* 65 - 99 mg/dL Final    Lab Results  Component Value Date   CA125 105.5* 05/24/2015     ASSESSMENT: Stage IV endometrial cancer    MEDICAL DECISION MAKING:  All lab data has been reviewed Patient's emergency room records have been reviewed.  CT scan of abdomen dated March 24 has been reviewed independently and reviewed with the patient.  most of the disease remains stable however there was some enlargement of omental metastases.  There was new area of subcapsular liver metastases. I had a long discussion with patient about further options of treatment which will include chemotherapy patient does not want any further chemotherapy or anti-hormonal therapy  Patient does have a living will and does not want any resuscitation She wants to be comfortable.  She understands that overall her life expectancy is limited.  We discussed possibility of palliative care and hospice care which she will consider at some point in time.  Information regarding hospice and hospice contacts given. Total duration of visit was 45 minutes.  50% or more time was spent in counseling patient and family regarding prognosis and options of treatment and available resources  Cancer of uterus   Staging form: Corpus Uteri - Carcinoma, AJCC 7th Edition     Clinical: Stage IVB (T3b, N0, M1) - Unsigned   Forest Gleason, MD   06/23/2015 1:42 PM

## 2015-06-30 ENCOUNTER — Other Ambulatory Visit: Payer: Self-pay | Admitting: Family Medicine

## 2015-07-10 ENCOUNTER — Telehealth: Payer: Self-pay | Admitting: *Deleted

## 2015-07-10 NOTE — Telephone Encounter (Signed)
Per Dr Oliva Bustard, ok to refill rx this time. And to advise pt to take tylenol with the Flexeril for the stiffness and not to take extra Flexeril. PC to pharmacy and then to pt whom I advised she would have to pay out of pocket for and had her repeat to me that she is not to take more Flexeril than prescribed and that she can take Tylenol with her Flexeril.

## 2015-07-10 NOTE — Telephone Encounter (Signed)
Received t/c from Glean Hess regarding her daughter Zoe [name]. She states her daughter has finally decided that she needs Hospice to come in and help her with pain control and some house keeping needs. Reports [name] has refused Hospice before, but is now receptive and she wants to know who she should contact. Informed that I will give the information to Dr. Metro Kung nurse and they will need to make the Hospice referral. Glenda's phone number along with the message were passed on the [facility], RN, who states she will need to see if Holley Raring is on the patient's list of who we can communicate with about her care. Yolande Jolly, BSN, MHA, OCN 07/10/2015 4:32 PM

## 2015-07-10 NOTE — Telephone Encounter (Signed)
Patient has requested a refill on her Flexeril 10 mg order to take BID, but it is not time for a refill. I called patient who reports that she has on her own doubled her dose to take 2 tabs at a time because her neck was stiff. I educated her that she needs to call md prior to adjusting her meds and she said she did it because she had the medicine. I told her that is why there is a problem that the pharmacy will not refill her med for her now and that her insurance company will not cover it either. I asked that she please call us before she adjusts any medications on her own.

## 2015-07-18 ENCOUNTER — Encounter: Payer: Self-pay | Admitting: Oncology

## 2015-07-18 ENCOUNTER — Inpatient Hospital Stay (HOSPITAL_BASED_OUTPATIENT_CLINIC_OR_DEPARTMENT_OTHER): Payer: Medicaid Other | Admitting: Oncology

## 2015-07-18 ENCOUNTER — Inpatient Hospital Stay: Payer: Medicaid Other | Attending: Oncology

## 2015-07-18 ENCOUNTER — Other Ambulatory Visit: Payer: Medicaid Other

## 2015-07-18 ENCOUNTER — Ambulatory Visit: Payer: Medicaid Other | Admitting: Oncology

## 2015-07-18 VITALS — BP 135/93 | HR 105 | Temp 97.0°F | Resp 20 | Wt 190.5 lb

## 2015-07-18 DIAGNOSIS — Z9221 Personal history of antineoplastic chemotherapy: Secondary | ICD-10-CM

## 2015-07-18 DIAGNOSIS — R809 Proteinuria, unspecified: Secondary | ICD-10-CM

## 2015-07-18 DIAGNOSIS — Z8052 Family history of malignant neoplasm of bladder: Secondary | ICD-10-CM

## 2015-07-18 DIAGNOSIS — M129 Arthropathy, unspecified: Secondary | ICD-10-CM | POA: Insufficient documentation

## 2015-07-18 DIAGNOSIS — C786 Secondary malignant neoplasm of retroperitoneum and peritoneum: Secondary | ICD-10-CM

## 2015-07-18 DIAGNOSIS — I878 Other specified disorders of veins: Secondary | ICD-10-CM | POA: Diagnosis not present

## 2015-07-18 DIAGNOSIS — Z794 Long term (current) use of insulin: Secondary | ICD-10-CM | POA: Diagnosis not present

## 2015-07-18 DIAGNOSIS — N1330 Unspecified hydronephrosis: Secondary | ICD-10-CM | POA: Diagnosis not present

## 2015-07-18 DIAGNOSIS — J449 Chronic obstructive pulmonary disease, unspecified: Secondary | ICD-10-CM | POA: Diagnosis not present

## 2015-07-18 DIAGNOSIS — G629 Polyneuropathy, unspecified: Secondary | ICD-10-CM

## 2015-07-18 DIAGNOSIS — R001 Bradycardia, unspecified: Secondary | ICD-10-CM

## 2015-07-18 DIAGNOSIS — C55 Malignant neoplasm of uterus, part unspecified: Secondary | ICD-10-CM

## 2015-07-18 DIAGNOSIS — R531 Weakness: Secondary | ICD-10-CM | POA: Insufficient documentation

## 2015-07-18 DIAGNOSIS — F17210 Nicotine dependence, cigarettes, uncomplicated: Secondary | ICD-10-CM | POA: Insufficient documentation

## 2015-07-18 DIAGNOSIS — D751 Secondary polycythemia: Secondary | ICD-10-CM | POA: Diagnosis not present

## 2015-07-18 DIAGNOSIS — F418 Other specified anxiety disorders: Secondary | ICD-10-CM

## 2015-07-18 DIAGNOSIS — C7989 Secondary malignant neoplasm of other specified sites: Secondary | ICD-10-CM | POA: Diagnosis not present

## 2015-07-18 DIAGNOSIS — E119 Type 2 diabetes mellitus without complications: Secondary | ICD-10-CM

## 2015-07-18 DIAGNOSIS — Z8711 Personal history of peptic ulcer disease: Secondary | ICD-10-CM | POA: Insufficient documentation

## 2015-07-18 DIAGNOSIS — Z7984 Long term (current) use of oral hypoglycemic drugs: Secondary | ICD-10-CM | POA: Diagnosis not present

## 2015-07-18 DIAGNOSIS — R918 Other nonspecific abnormal finding of lung field: Secondary | ICD-10-CM | POA: Insufficient documentation

## 2015-07-18 DIAGNOSIS — F329 Major depressive disorder, single episode, unspecified: Secondary | ICD-10-CM

## 2015-07-18 DIAGNOSIS — D72829 Elevated white blood cell count, unspecified: Secondary | ICD-10-CM

## 2015-07-18 DIAGNOSIS — C787 Secondary malignant neoplasm of liver and intrahepatic bile duct: Secondary | ICD-10-CM | POA: Insufficient documentation

## 2015-07-18 DIAGNOSIS — D473 Essential (hemorrhagic) thrombocythemia: Secondary | ICD-10-CM | POA: Diagnosis not present

## 2015-07-18 DIAGNOSIS — Z79899 Other long term (current) drug therapy: Secondary | ICD-10-CM | POA: Insufficient documentation

## 2015-07-18 LAB — CBC WITH DIFFERENTIAL/PLATELET
BASOS ABS: 0.2 10*3/uL — AB (ref 0–0.1)
Basophils Relative: 1 %
EOS ABS: 0.1 10*3/uL (ref 0–0.7)
Eosinophils Relative: 0 %
HCT: 35.1 % (ref 35.0–47.0)
HEMOGLOBIN: 11.7 g/dL — AB (ref 12.0–16.0)
LYMPHS ABS: 1.7 10*3/uL (ref 1.0–3.6)
LYMPHS PCT: 8 %
MCH: 29.8 pg (ref 26.0–34.0)
MCHC: 33.4 g/dL (ref 32.0–36.0)
MCV: 89.3 fL (ref 80.0–100.0)
Monocytes Absolute: 1 10*3/uL — ABNORMAL HIGH (ref 0.2–0.9)
Monocytes Relative: 5 %
NEUTROS PCT: 86 %
Neutro Abs: 18.2 10*3/uL — ABNORMAL HIGH (ref 1.4–6.5)
PLATELETS: 549 10*3/uL — AB (ref 150–440)
RBC: 3.93 MIL/uL (ref 3.80–5.20)
RDW: 15.8 % — ABNORMAL HIGH (ref 11.5–14.5)
WBC: 21.2 10*3/uL — AB (ref 3.6–11.0)

## 2015-07-18 LAB — COMPREHENSIVE METABOLIC PANEL
ALBUMIN: 3.1 g/dL — AB (ref 3.5–5.0)
ALT: 9 U/L — ABNORMAL LOW (ref 14–54)
ANION GAP: 9 (ref 5–15)
AST: 17 U/L (ref 15–41)
Alkaline Phosphatase: 181 U/L — ABNORMAL HIGH (ref 38–126)
BUN: 26 mg/dL — ABNORMAL HIGH (ref 6–20)
CHLORIDE: 97 mmol/L — AB (ref 101–111)
CO2: 25 mmol/L (ref 22–32)
Calcium: 9 mg/dL (ref 8.9–10.3)
Creatinine, Ser: 0.69 mg/dL (ref 0.44–1.00)
GFR calc non Af Amer: >60 mL/min (ref >60)
GLUCOSE: 388 mg/dL — AB (ref 65–99)
Potassium: 4.3 mmol/L (ref 3.5–5.1)
SODIUM: 131 mmol/L — AB (ref 135–145)
Total Bilirubin: 0.2 mg/dL — ABNORMAL LOW (ref 0.3–1.2)
Total Protein: 7.9 g/dL (ref 6.5–8.1)

## 2015-07-18 MED ORDER — FENTANYL 25 MCG/HR TD PT72: 25.0000 ug | MEDICATED_PATCH | TRANSDERMAL | Status: DC

## 2015-07-18 MED ORDER — OXYCODONE HCL 10 MG PO TABS: 10.0000 mg | ORAL_TABLET | Freq: Three times a day (TID) | ORAL | Status: DC | PRN

## 2015-07-18 NOTE — Progress Notes (Signed)
Patient here for ongoing evaluation of uterine cancer. Patient states she has terminal cancer. She would like to discuss hospice and end of life care with Dr Oliva Bustard. Patient is tearful. Increased weakness. Decreased appetite. Needs refill on her oxycodone today. She verbalized passing blood vaginally for a couple of days in a row this week. Patient is in a wheelchair. She has taken herself off of her lantus insulin and metformin. She said she notified her PCP of that.

## 2015-07-19 ENCOUNTER — Encounter: Payer: Self-pay | Admitting: Oncology

## 2015-07-19 LAB — CA 125: CA 125: 386.1 U/mL — AB (ref 0.0–38.1)

## 2015-07-19 NOTE — Progress Notes (Signed)
[place] @ [facility] [phone]  Fax:([phone]) [phone]     Zoe [name] OB: 1960/11/02  MR#: [id number]  BJS#:[id number]  Patient Care Team: Sharyne Peach, MD as PCP - General (Family Medicine) Algernon Huxley, MD as Consulting Physician (Vascular Surgery)  CHIEF COMPLAINT:  Chief Complaint  Patient presents with  . Follow-up    Uterine cancer    Oncology History   Chief Complaint/Diagnosis:   1. Endometroid adenocarcinoma, stage IIIC, grade 3, TAHBSO and complete staging. 2. Carboplatin and paclitaxel x 3.  XRT, treatments delayed due to multiple factors. 3. CT 03/20/14 Findings are compatible with widespread metastatic disease.Specifically, there appears to be local recurrence of disease in the low anatomic pelvis with and ill-defined amorphous soft tissue mass extending from the right side of the vaginal apex to the pelvic sidewall, causing some mild obstruction at the junction of distal and middle third of the right ureter (with mild proximal right hydroureteronephrosis), widespread omental and intraperitoneal metastases, retroperitoneal lymphadenopathy and new pulmonary nodules, as detailed above. 4. February 2016, Biopsy of abdominal mass is positive for malignancy consistent with endometrial cancer. 5. Started Carboplatin AUC 5 and Taxol 127m/m2, 04/17/2014.     [place]nexa ([place]   07/10/2014 Initial Diagnosis Cancer of uterine adnexa    Cancer of uterus ([facility]   03/09/2014 Initial Diagnosis Cancer of uterus      INTERVAL HISTORY: 55year old lady came today further follow-up regarding her recurrent endometrial cancer.  Patient is here for ongoing evaluation and treatment consideration.  Has persistent weakness in both lower extremity failed during the New Year's Eve.  Abdominal cramps.  No nausea no vomiting.  Tingling numbness and lower extremity persists.  Patient is rising tumor marker Continues to be very depressed. Patient had a CT scan.  Here to  discuss the result patient also has progressing and rising tumor markers.  Patient does not want any further chemotherapy.  Patient did not like Megace       Patient here for ongoing evaluation of uterine cancer. Patient states she has terminal cancer. She would like to discuss hospice and end of life care with Dr COliva Bustard Patient is tearful. Increased weakness. Decreased appetite. Needs refill on her oxycodone today. She verbalized passing blood vaginally for a couple of days in a row this week. Patient is in a wheelchair. She has taken herself off of her lantus insulin and metformin. She said she notified her PCP of that.   She is very emotional.  Declining condition.  His increasing abdominal discomfort and pain   REVIEW OF SYSTEMS:   GENERAL:  Feels good.  Active.  No fevers, sweats or weight loss. PERFORMANCE STATUS (ECOG):  01 HEENT:  No visual changes, runny nose, sore throat, mouth sores or tenderness. Lungs: No shortness of breath or cough.  No hemoptysis. Cardiac:  No chest pain, palpitations, orthopnea, or PND. GI:  No nausea, vomiting, diarrhea, constipation, melena or hematochezia. GU:  No urgency, frequency, dysuria, or hematuria. Musculoskeletal:  No back pain.  No joint pain.  No muscle tenderness. Extremities:  No pain or swelling. Skin:  No rashes or skin changes. Neuro:  No headache, numbness or weakness, balance or coordination issues. Endocrine:  No diabetes, thyroid issues, hot flashes or night sweats. Psych:  No mood changes, depression or anxiety. Pain:  No focal pain. Review of systems:  All other systems reviewed and found to be negative.  As per HPI. Otherwise, a complete review of systems is negatve.  Additional Past Medical and Surgical History: Past Medical History/Past Surgical History -  Diabetes mellitus  Chronic obstructive pulmonary disease  Allergic rhinitis  Depression  Chronic smoker  Chronic leukocytosis, thrombocytosis,  erythrocytosis  Proteinuria, on Accupril started in 2003  Venous stasis  Peripheral neuropathy  Stomach ulcer 1980 with GI bleed  Arthritis right shoulder  Cholecystectomy  Anxiety/depression  Cataracts    Family History - remarkable for diabetes, hyperlipidemia, hypertension, heart disease, bladder cancer and leukemia.    Social History - chronic smoker 45-pack-years, ongoing.  Denies alcohol or recreational drug usage.  Physically active and ambulatory.      ADVANCED DIRECTIVES: Patient does not have any advanced healthcare directive. Information has been given.   HEALTH MAINTENANCE: Social History  Substance Use Topics  . Smoking status: Current Every Day Smoker  . Smokeless tobacco: None  . Alcohol Use: None     Allergies  Allergen Reactions  . Aspirin     Other reaction(s): Unknown  . Codeine     Other reaction(s): Unknown    Current Outpatient Prescriptions  Medication Sig Dispense Refill  . ADVAIR DISKUS 250-50 MCG/DOSE AEPB Inhale 1 puff into the lungs every morning.   1  . B-D ULTRAFINE III SHORT PEN 31G X 8 MM MISC   0  . Calcium Carbonate-Vitamin D (CALTRATE 600+D) 600-400 MG-UNIT per tablet Take 1 tablet by mouth 2 (two) times daily.    . cyclobenzaprine (FLEXERIL) 10 MG tablet Take 1 tablet (10 mg total) by mouth 2 (two) times daily. 60 tablet 3  . cyclobenzaprine (FLEXERIL) 10 MG tablet Take by mouth.    . cyclobenzaprine (FLEXERIL) 5 MG tablet take 1 tablet by mouth three times a day if needed  0  . DULoxetine (CYMBALTA) 30 MG capsule Take by mouth.    . furosemide (LASIX) 20 MG tablet Take 20 mg by mouth. take 2 tablets by mouth once daily    . gabapentin (NEURONTIN) 300 MG capsule 300 mg 4 (four) times daily.   1  . glipiZIDE (GLUCOTROL XL) 5 MG 24 hr tablet Take 5 mg by mouth daily with breakfast.   1  . lisinopril (PRINIVIL,ZESTRIL) 10 MG tablet Take 10 mg by mouth daily.   1  . Magnesium 250 MG TABS Take 2 tablets (500 mg total) by mouth daily.  0   . montelukast (SINGULAIR) 10 MG tablet take 1 tablet by mouth every evening 30 tablet 3  . omeprazole (PRILOSEC) 20 MG capsule take 1 capsule by mouth once daily 30 capsule 3  . Oxycodone HCl 10 MG TABS Take 1 tablet (10 mg total) by mouth 3 (three) times daily as needed. 90 tablet 0  . PROAIR HFA 108 (90 BASE) MCG/ACT inhaler Inhale 4 puffs into the lungs as needed.   1  . traZODone (DESYREL) 50 MG tablet Take 50 mg by mouth at bedtime.   0  . fentaNYL (DURAGESIC - DOSED MCG/HR) 25 MCG/HR patch Place 1 patch (25 mcg total) onto the skin every 3 (three) days. 5 patch 0  . LANTUS SOLOSTAR 100 UNIT/ML Solostar Pen Reported on 07/18/2015  1  . metFORMIN (GLUCOPHAGE) 1000 MG tablet Take 1,000 mg by mouth 2 (two) times daily with a meal. Reported on 07/18/2015  0  . ondansetron (ZOFRAN) 4 MG tablet take 1 tablet by mouth every 8 hours if needed for nausea and vomiting OR REFRACTORY NAUSEA AND VOMITING (Patient not taking: Reported on 07/18/2015) 20 tablet 0  . simvastatin (ZOCOR) 20  MG tablet Take 20 mg by mouth daily at 6 PM.      No current facility-administered medications for this visit.    OBJECTIVE:  Filed Vitals:   07/18/15 1416  BP: 135/93  Pulse: 105  Temp: 97 F (36.1 C)  Resp: 20     Body mass index is 33.75 kg/(m^2).    ECOG FS:1 - Symptomatic but completely ambulatory  PHYSICAL EXAM: General status: Performance status is good.  Patient has not lost significant weight HEENT: No evidence of stomatitis.  And alopecia Sclera and conjunctivae :: No jaundice.   pale looking . Lungs: Air  entry equal on both sides.  No rhonchi.  No rales.  Cardiac: Heart sounds are normal.  No pericardial rub.  No murmur. Lymphatic system: Cervical, axillary, inguinal, lymph nodes not palpable GI: Abdomen is soft.  No ascites.  Liver spleen not palpable.  No tenderness.  Bowel sounds are within normal limit Lower extremity: No edema Neurological system: Higher functions, cranial nerves intact.   Peripheral neuropathy  No evidence of peripheral neuropathy. Skin: No rash.  No ecchymosis.[facility] system : Patient's mood has improved.  [facility]  .Sugar is extremely high.  Sodium is low. I discussed with patient that she needs to contact primary care physician regarding diabetes.  Possibility of running any PET scan would be considered to evaluate for progression of disease. LAB RESULTS:  Appointment on 07/18/2015  Component Date Value Ref Range Status  . WBC 07/18/2015 21.2* 3.6 - 11.0 K/uL Final  . RBC 07/18/2015 3.93  3.80 - 5.20 MIL/uL Final  . Hemoglobin 07/18/2015 11.7* 12.0 - 16.0 g/dL Final  . HCT 07/18/2015 35.1  35.0 - 47.0 % Final  . MCV 07/18/2015 89.3  80.0 - 100.0 fL Final  . MCH 07/18/2015 29.8  26.0 - 34.0 pg Final  . MCHC 07/18/2015 33.4  32.0 - 36.0 g/dL Final  . RDW 07/18/2015 15.8* 11.5 - 14.5 % Final  . Platelets 07/18/2015 549* 150 - 440 K/uL Final  . Neutrophils Relative % 07/18/2015 86   Final  . Neutro Abs 07/18/2015 18.2* 1.4 - 6.5 K/uL Final  . Lymphocytes Relative 07/18/2015 8   Final  . Lymphs Abs 07/18/2015 1.7  1.0 - 3.6 K/uL Final  . Monocytes Relative 07/18/2015 5   Final  . Monocytes Absolute 07/18/2015 1.0* 0.2 - 0.9 K/uL Final  . Eosinophils Relative 07/18/2015 0   Final  . Eosinophils Absolute 07/18/2015 0.1  0 - 0.7 K/uL Final  . Basophils Relative 07/18/2015 1   Final  . Basophils Absolute 07/18/2015 0.2* 0 - 0.1 K/uL Final  . Sodium 07/18/2015 131* 135 - 145 mmol/L Final  . Potassium 07/18/2015 4.3  3.5 - 5.1 mmol/L Final  . Chloride 07/18/2015 97* 101 - 111 mmol/L Final  . CO2 07/18/2015 25  22 - 32 mmol/L Final  . Glucose, Bld 07/18/2015 388* 65 - 99 mg/dL Final  . BUN 07/18/2015 26* 6 - 20 mg/dL Final  . Creatinine, Ser 07/18/2015 0.69  0.44 - 1.00 mg/dL Final  . Calcium 07/18/2015 9.0  8.9 - 10.3 mg/dL Final  . Total Protein 07/18/2015 7.9  6.5 - 8.1 g/dL Final  . Albumin 07/18/2015 3.1* 3.5 - 5.0 g/dL Final  . AST 07/18/2015 17   15 - 41 U/L Final  . ALT 07/18/2015 9* 14 - 54 U/L Final  . Alkaline Phosphatase 07/18/2015 181* 38 - 126 U/L Final  . Total Bilirubin 07/18/2015 0.2* 0.3 - 1.2 mg/dL Final  .  GFR calc non Af Amer 07/18/2015 >60  >60 mL/min Final  . GFR calc Af Amer 07/18/2015 >60  >60 mL/min Final   Comment: (NOTE) The eGFR has been calculated using the CKD EPI equation. This calculation has not been validated in all clinical situations. eGFR's persistently <60 mL/min signify possible Chronic Kidney Disease.   . Anion gap 07/18/2015 9  5 - 15 Final  . CA 125 07/18/2015 386.1* 0.0 - 38.1 U/mL Final   Comment: (NOTE) Roche ECLIA methodology Performed At: [facility] [address] [place], [place] [id number] Lindon Romp MD [id number]     Lab Results  Component Value Date   CA125 386.1* 07/18/2015     ASSESSMENT: Stage IV endometrial cancer    MEDICAL DECISION MAKING:  All lab data has been reviewed Patient's emergency room records have been reviewed.  CT scan of abdomen dated March 24 has been reviewed independently and reviewed with the patient.  most of the disease remains stable however there was some enlargement of omental metastases.  There was new area of subcapsular liver metastases. Increasing abdominal pain.  We will start patient on fentanyl patch 25 g every 3 days apart. Breakthrough pain medication will be oxycodone We discussed possibility of getting hospice evaluation.  Patient is now bradycardia for hospice evaluation her concerns are that mother is not accepting her terminal condition. Total duration of visit was39mnutes.  50% or more time was spent in counseling patient and family regarding prognosis and options of treatment and available resources Patient has been referred to hospice.  Plan would be to gradually increase fentanyl patch  Cancer of uterus   Staging form: Corpus Uteri - Carcinoma, AJCC 7th Edition     Clinical: Stage IVB (T3b, N0, M1) -  Unsigned   JForest Gleason MD   07/19/2015 1:37 PM

## 2015-07-24 ENCOUNTER — Other Ambulatory Visit: Payer: Self-pay | Admitting: *Deleted

## 2015-07-24 DIAGNOSIS — C55 Malignant neoplasm of uterus, part unspecified: Secondary | ICD-10-CM

## 2015-07-24 MED ORDER — FENTANYL 25 MCG/HR TD PT72: 50.0000 ug | MEDICATED_PATCH | TRANSDERMAL | Status: DC

## 2015-07-24 NOTE — Telephone Encounter (Signed)
Asking to increase fentanyl to 50 mcg due to pain not being controlled. She is also needing a refill on her Oxycodone.  When I was checking on refill, I noted she got # 90 tabs on 5/18. I called Lattie Haw back to discuss this and she said pt told her she takes them tid and sometimes an extra one when she is hurting really bad and that she only has #10 tabs left. She is going to check with patient again regarding use and call me back. I let her know per Dr Oliva Bustard that her Fentanyl can be increased to 50 mcg and that he wants more info on Oxycodone and for Hospice to manage and lock her pills up. He said he will refill it if needed.  I discussed with Lattie Haw again and she said pt did not answer when she called her and she will discuss lock box. I told her since she has 10 tabs left  and she is only to take 3 a day we will wait until she hears back from patient before refilling this, I also informed her of ok to increase Fentanyl to 50 mcg and was told pt will change patch tomorrow and put on 2 patches leaving her with 1 patch so refill will be needed for 50 mcg patch

## 2015-07-25 MED ORDER — FENTANYL 50 MCG/HR TD PT72: 50.0000 ug | MEDICATED_PATCH | TRANSDERMAL | Status: AC

## 2015-07-25 NOTE — Addendum Note (Signed)
Addended by: Betti Cruz on: 07/25/2015 10:44 AM   Modules accepted: Orders

## 2015-07-25 NOTE — Telephone Encounter (Signed)
Received a call back from [place] that pt returned her call adn said that she had more Oxycodone than she had reported, that she had separated them and had them somewhere else and that she does not need refill right now as she has 48 tabs on hand. We discussed that pt has a lot of people in and out of her home and I asked if she would put a lock box in the home and she will discuss with pt. She does need Fentanyl 50 mcg patches which will be faxed

## 2015-07-30 ENCOUNTER — Telehealth: Payer: Self-pay | Admitting: *Deleted

## 2015-07-30 DIAGNOSIS — C55 Malignant neoplasm of uterus, part unspecified: Secondary | ICD-10-CM

## 2015-07-30 NOTE — Telephone Encounter (Signed)
Called to report that patient reported that her pain is a 10/10 due to the fact that she spilled her entire bottle of pills down the sink Sunday. She refuses to have a lock box stating that no one comes in her bedroom where she keeps her pills, but does agree to having a very strict pill count every visit and was advised that it will not be refilled early again if we agree to fill it early this time.

## 2015-07-31 MED ORDER — OXYCODONE HCL 10 MG PO TABS: 10.0000 mg | ORAL_TABLET | Freq: Three times a day (TID) | ORAL | Status: AC | PRN

## 2015-07-31 NOTE — Telephone Encounter (Signed)
Rx faxed

## 2015-07-31 NOTE — Telephone Encounter (Signed)
Per Dr Oliva Bustard, if she does not agree to a lock box, he will not give her narcotics. I spoke with Lattie Haw and explained this and she said she will take a lock box, pick up rx herself and give her a 1 week supply and let her know she will not get more if she uses it before the week is out

## 2015-12-01 DEATH — deceased

## 2017-04-02 IMAGING — CT CT ABD-PELV W/ CM
1 of 3 series · 13 of 32 positions shown, 18 images · IV contrast (omnipaque)
Comparison: 03/20/2014

ADDENDUM:
The original report was by Dr. Jasinto Brandt. The following
addendum is by Dr. Jasinto Brandt:

A final impression for this report should read, "2. Distal rectal
wall thickening, possibly due to radiation therapy but technically
nonspecific."
CLINICAL DATA: Stage III uterine cancer in 8866; stage IV ovarian
cancer in 1531. Chemotherapy. Leg and body pain.
EXAM:
CT ABDOMEN AND PELVIS WITH CONTRAST
TECHNIQUE: Multidetector CT imaging of the abdomen and pelvis was performed
using the standard protocol following bolus administration of
intravenous contrast.
CONTRAST:  100 cc Omnipaque 300

[Series 2: routine abd pel with · axial · 0.83mm/px · z∈[-1018,-658]mm · 13 of 82 slices shown, 18 images]
[im 5/82  soft-tissue]
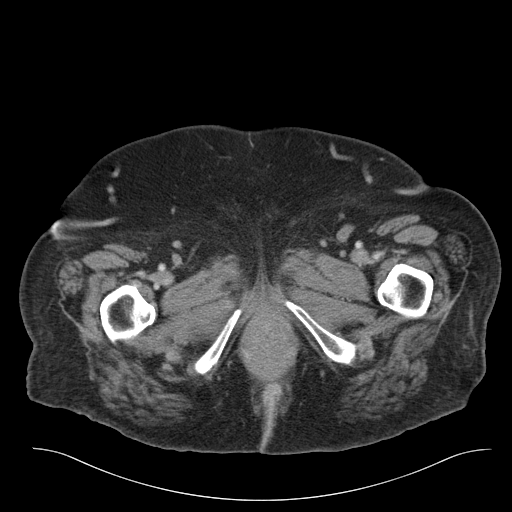
[im 5/82  bone]
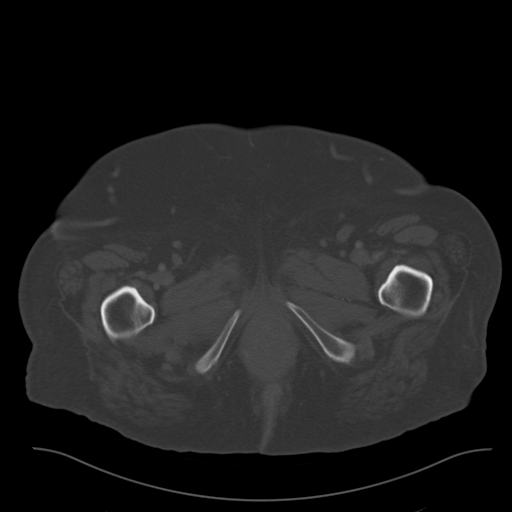
[im 13/82  soft-tissue]
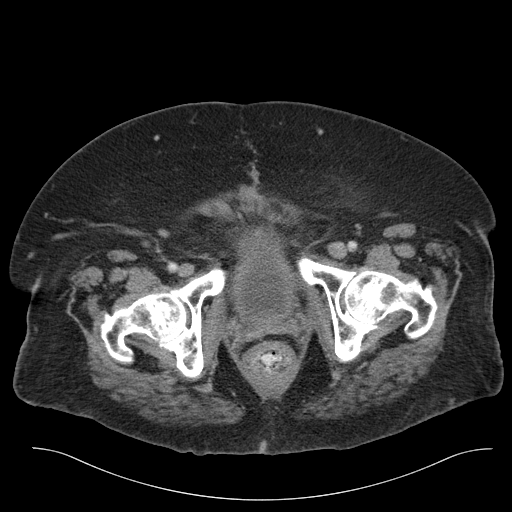
[im 18/82  soft-tissue]
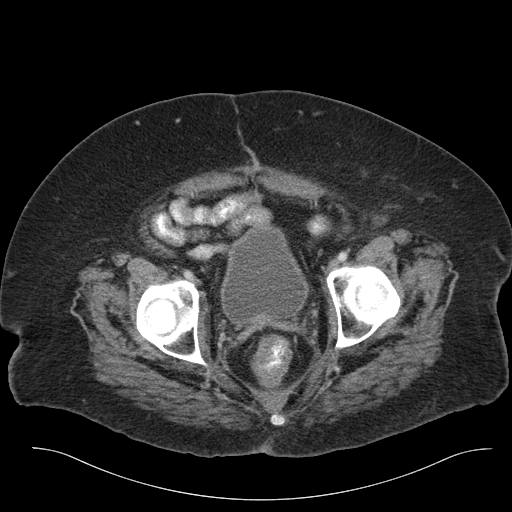
[im 26/82  soft-tissue]
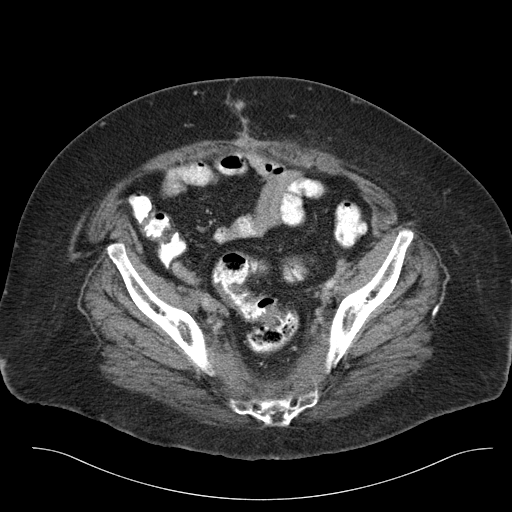
[im 30/82  soft-tissue]
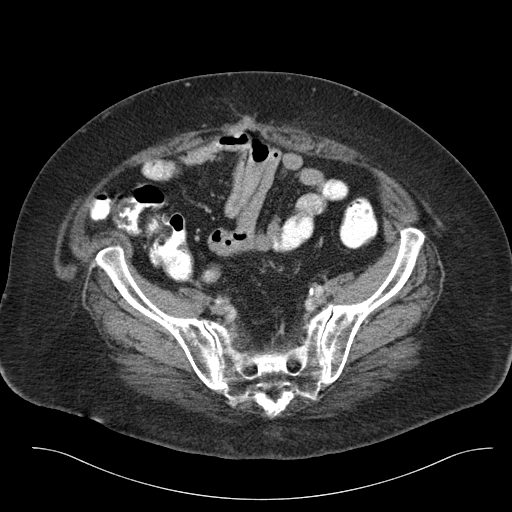
[im 39/82  soft-tissue]
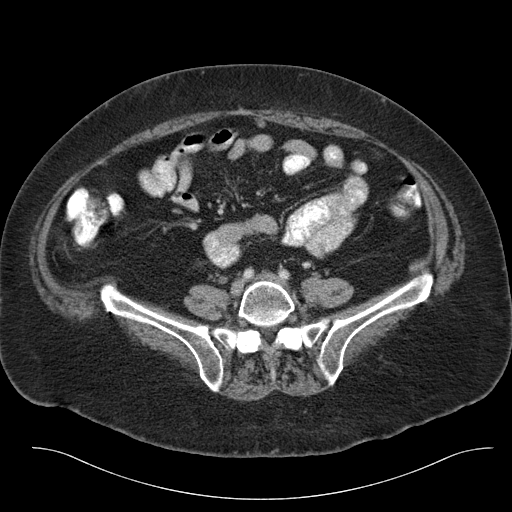
[im 43/82  soft-tissue]
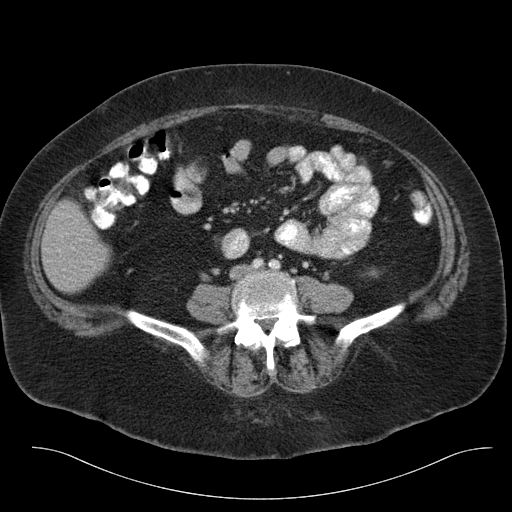
[im 52/82  soft-tissue]
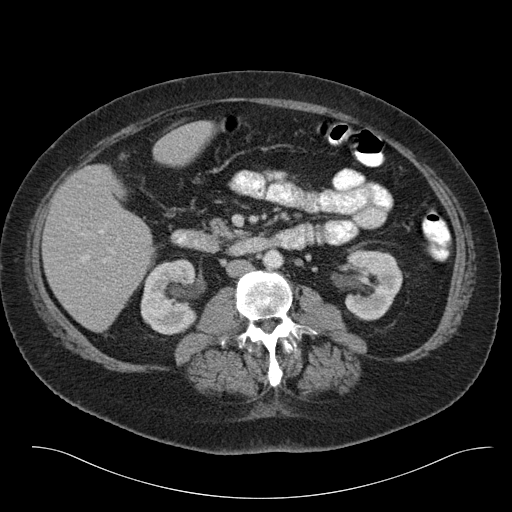
[im 56/82  soft-tissue]
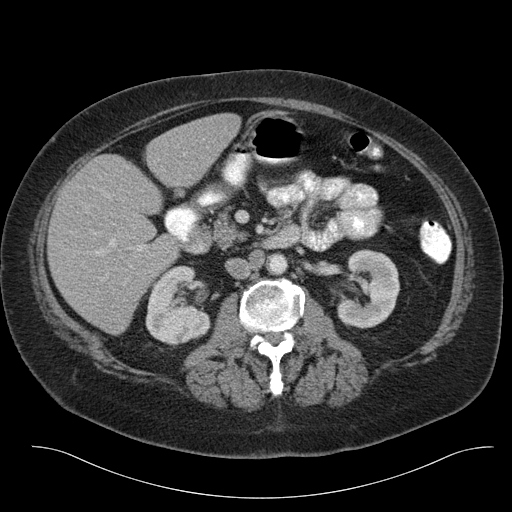
[im 56/82  bone]
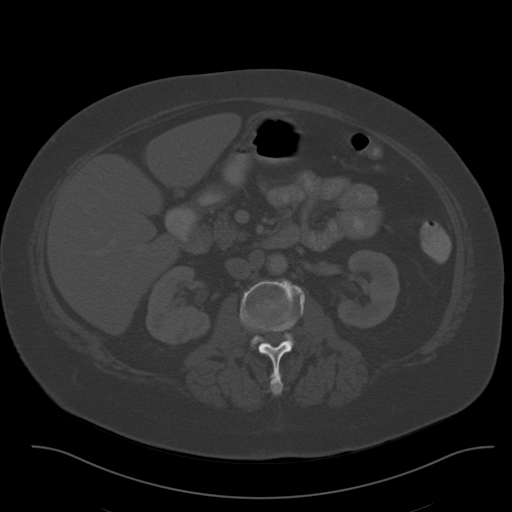
[im 64/82  soft-tissue]
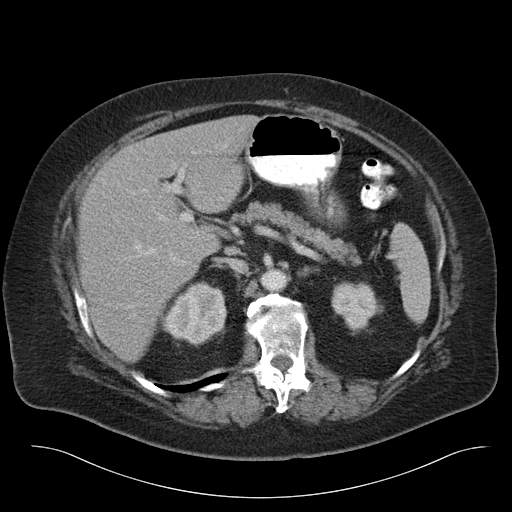
[im 64/82  lung]
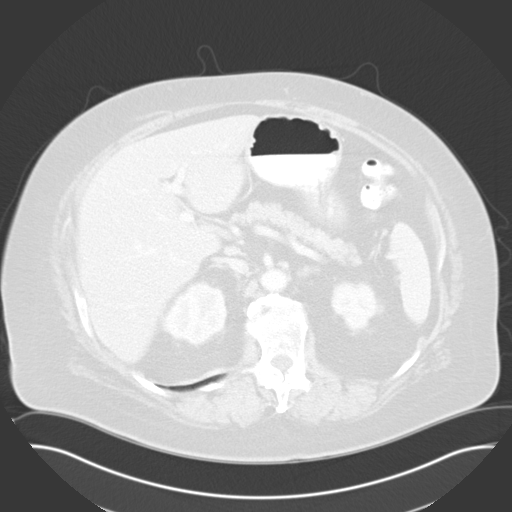
[im 69/82  soft-tissue]
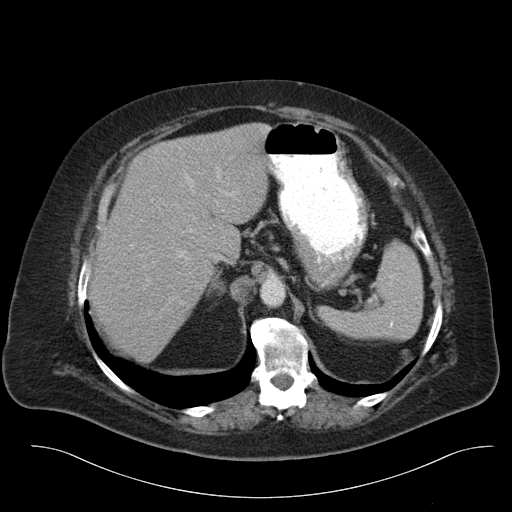
[im 69/82  lung]
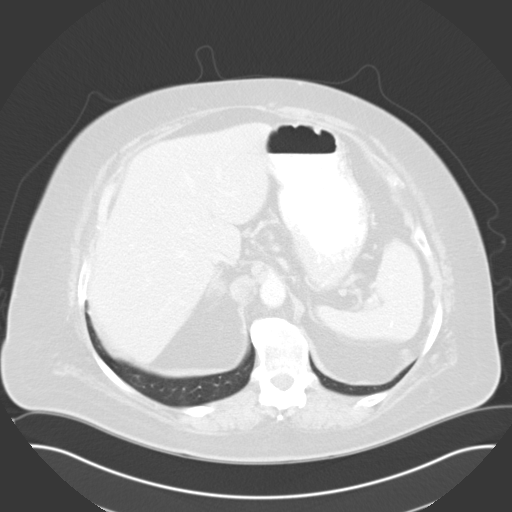
[im 73/82  lung]
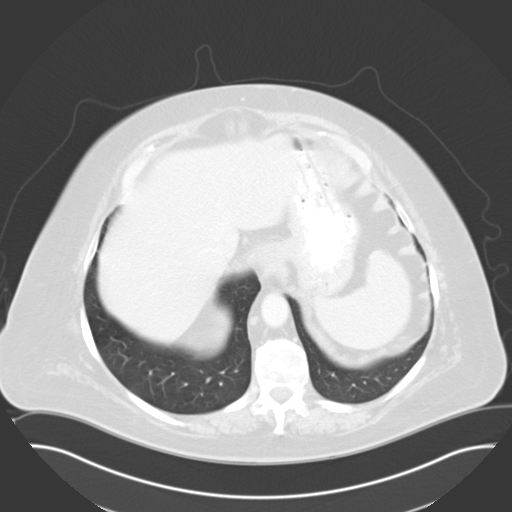
[im 77/82  soft-tissue]
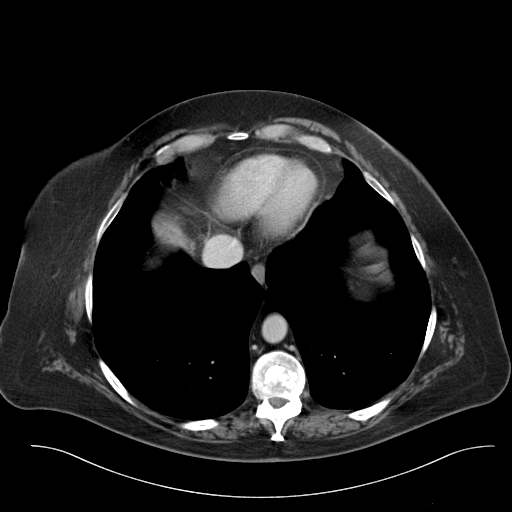
[im 77/82  lung]
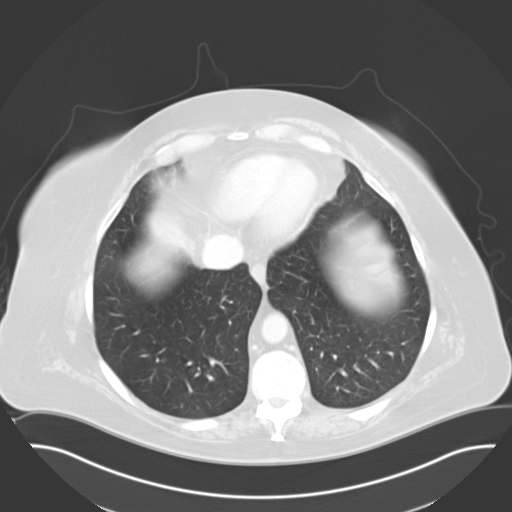

[13 of 32 positions shown; findings below may reference images not displayed]

FINDINGS: Lower chest:  Unremarkable

Hepatobiliary: The soft tissue nodule adjacent to segment 3 liver
measures 0.9 by 0.8 cm on image 27 of series 2 (formerly 1.4 by
cm). The smaller implant along the gallbladder fossa is no longer
well seen. Cholecystectomy.

Pancreas: Unremarkable

Spleen: Unremarkable

Adrenals/Urinary Tract: Stable small left adrenal nodules from 8866.
Slight fullness of the right collecting system, possibly due to the
adjacent stranding in the vicinity of the iliac cross over causing
some low grade partial obstruction.

Stomach/Bowel: Distal rectal wall thickening

Vascular/Lymphatic: Aortoiliac atherosclerotic vascular disease.
Aortocaval lymph node 1.4 cm in short axis on image 27 series 2,
formerly the same. Small porta hepatis and peripancreatic lymph
nodes observed.

Reproductive: Similar appearance of soft tissue density extending
from the right vaginal cuff towards the right pelvic sidewall
adjacent to the iliac vessels. The right ureter partially courses
through this density. Hysterectomy. Ovaries not seen.

Other: Omental and mesenteric implants of tumor are significantly
improved. They are fewer and reduced in size. For example, an
omental implant in the left anterior abdomen previously measuring
2.3 by 1.7 cm currently measures 0.8 by 0.7 cm on image 50 of series
2. The presacral edema/stranding which is confluent with the
piriformis musculature appear stable. No ascites.

Musculoskeletal: Thoracic and lumbar spondylosis.
IMPRESSION: 1. Peritoneal tumor implants are significantly reduced in size.
